# Patient Record
Sex: Female | Born: 1995
Health system: Southern US, Community
[De-identification: ages and names within clinical notes are randomized; demographics above are authoritative.]

## PROBLEM LIST (undated history)

## (undated) DIAGNOSIS — M25561 Pain in right knee: Secondary | ICD-10-CM

## (undated) HISTORY — PX: KNEE ARTHROSCOPY: SHX127

---
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
## 2017-03-18 DIAGNOSIS — N926 Irregular menstruation, unspecified: Secondary | ICD-10-CM | POA: Diagnosis not present

## 2017-03-23 IMAGING — CR DG ANKLE COMPLETE 3+V*R*
4 series · 4 of 4 positions shown · non-contrast
Comparison: None.

CLINICAL DATA: Acute right ankle pain after twisting injury.

EXAM:
RIGHT ANKLE - COMPLETE 3+ VIEW

[AP]
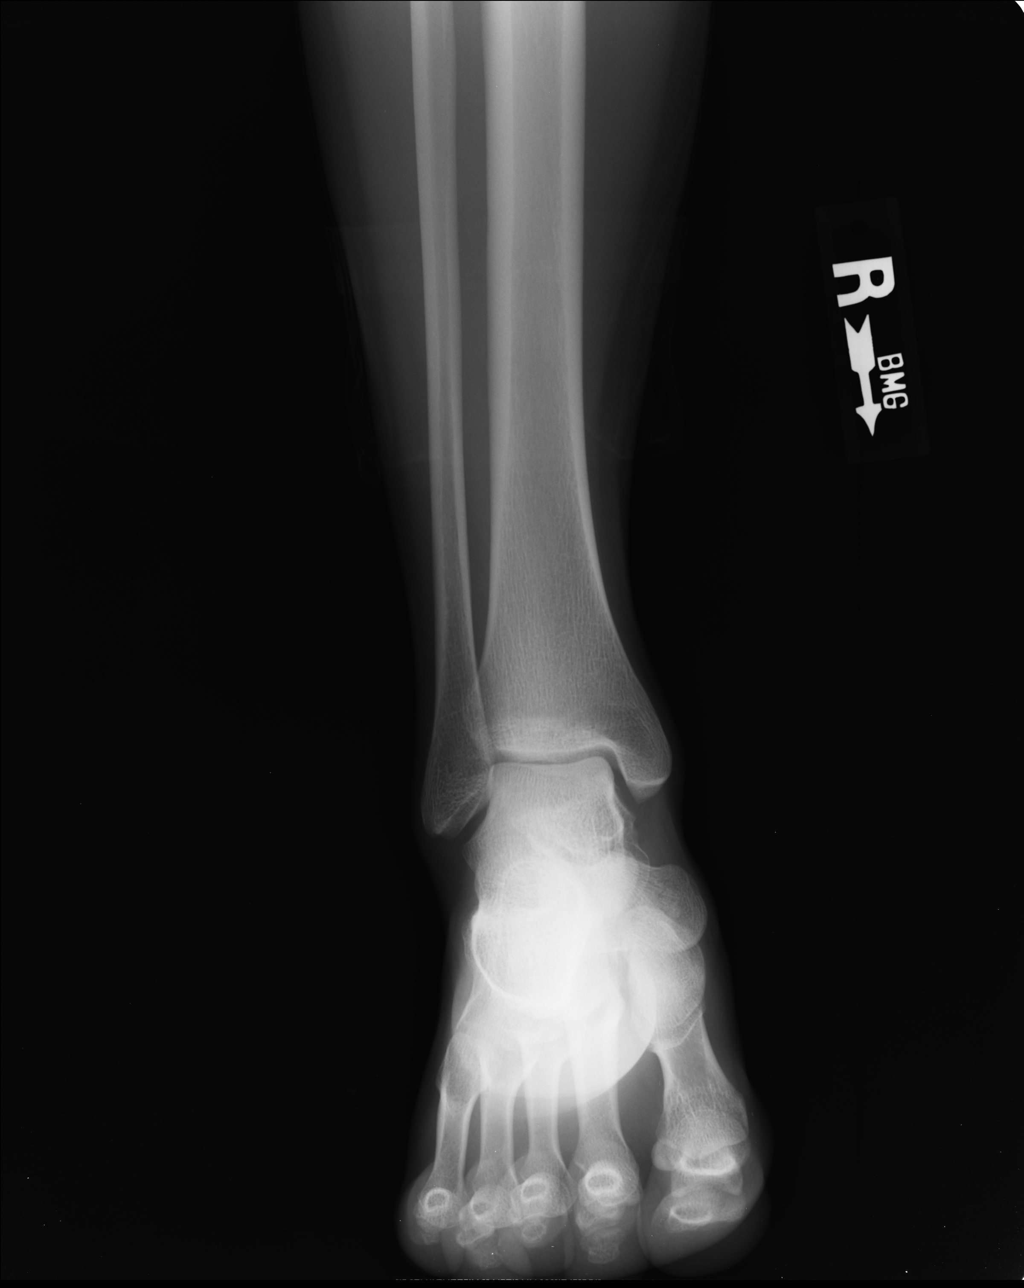

[ap obl int rot]
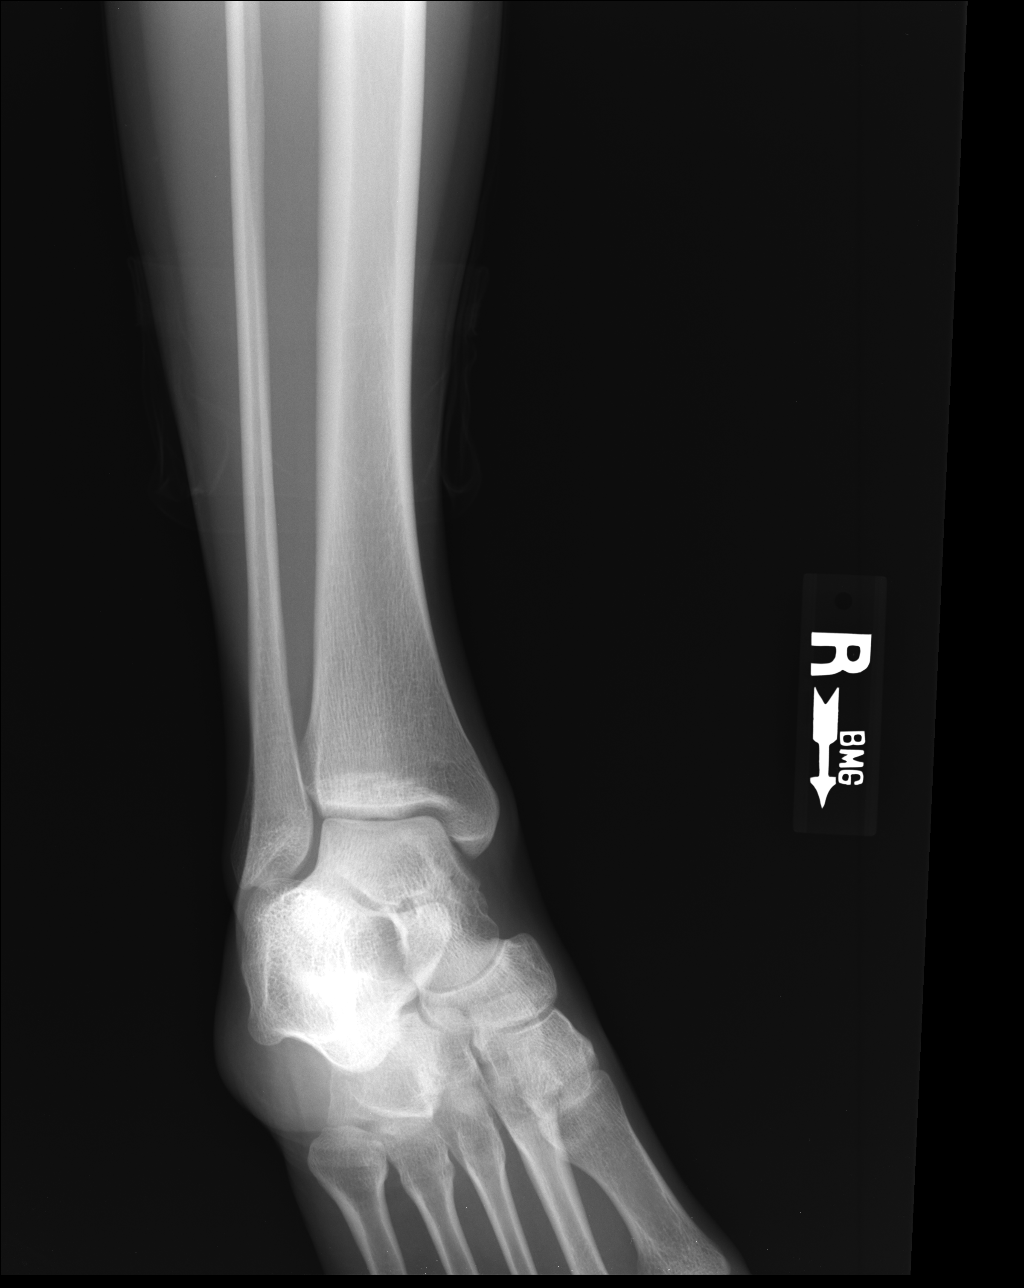

[medial obl]
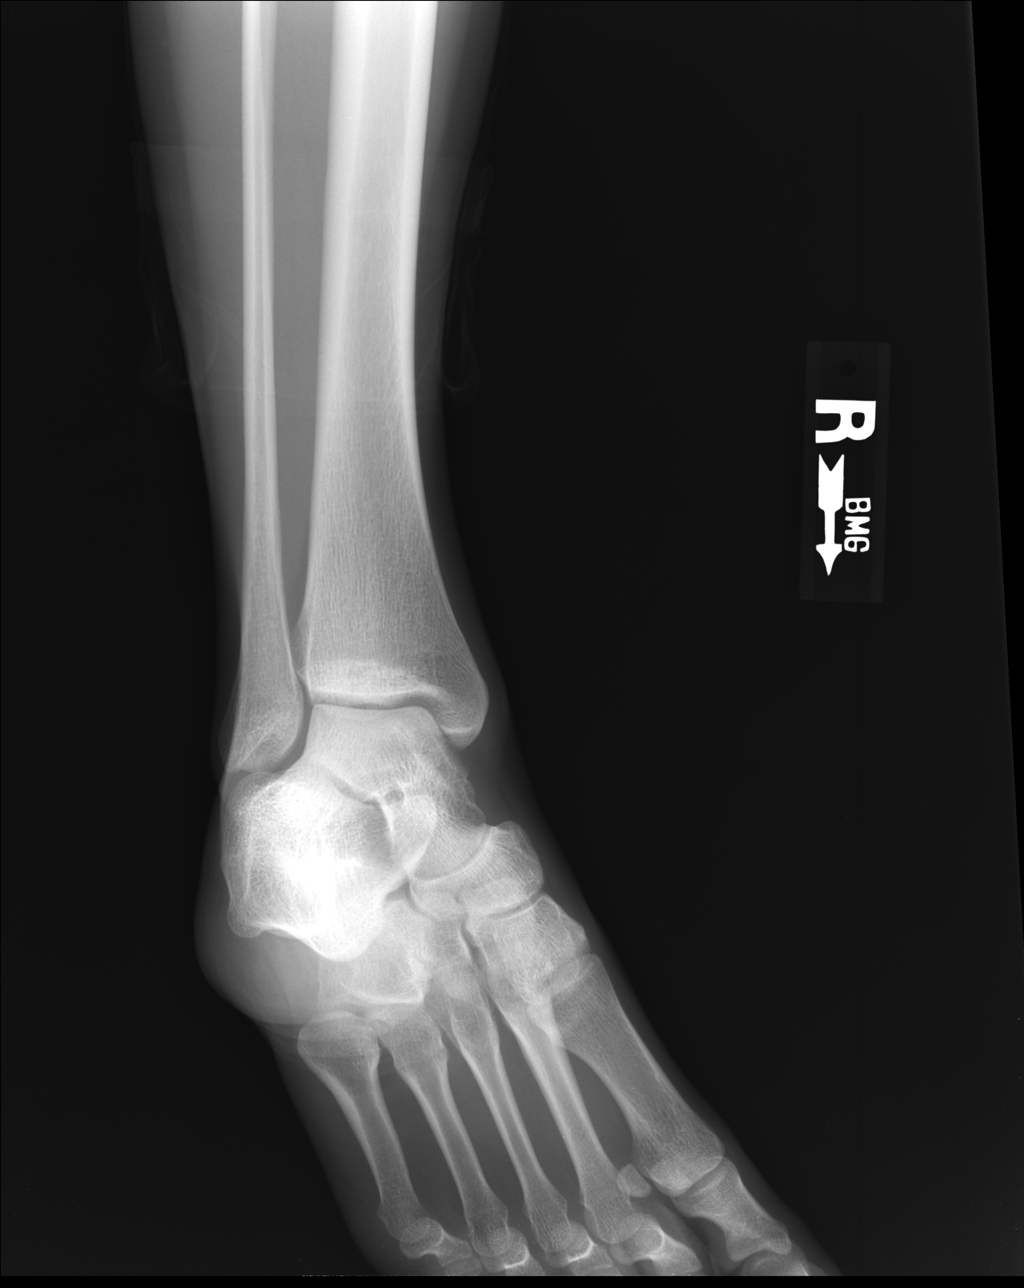

[lateral]
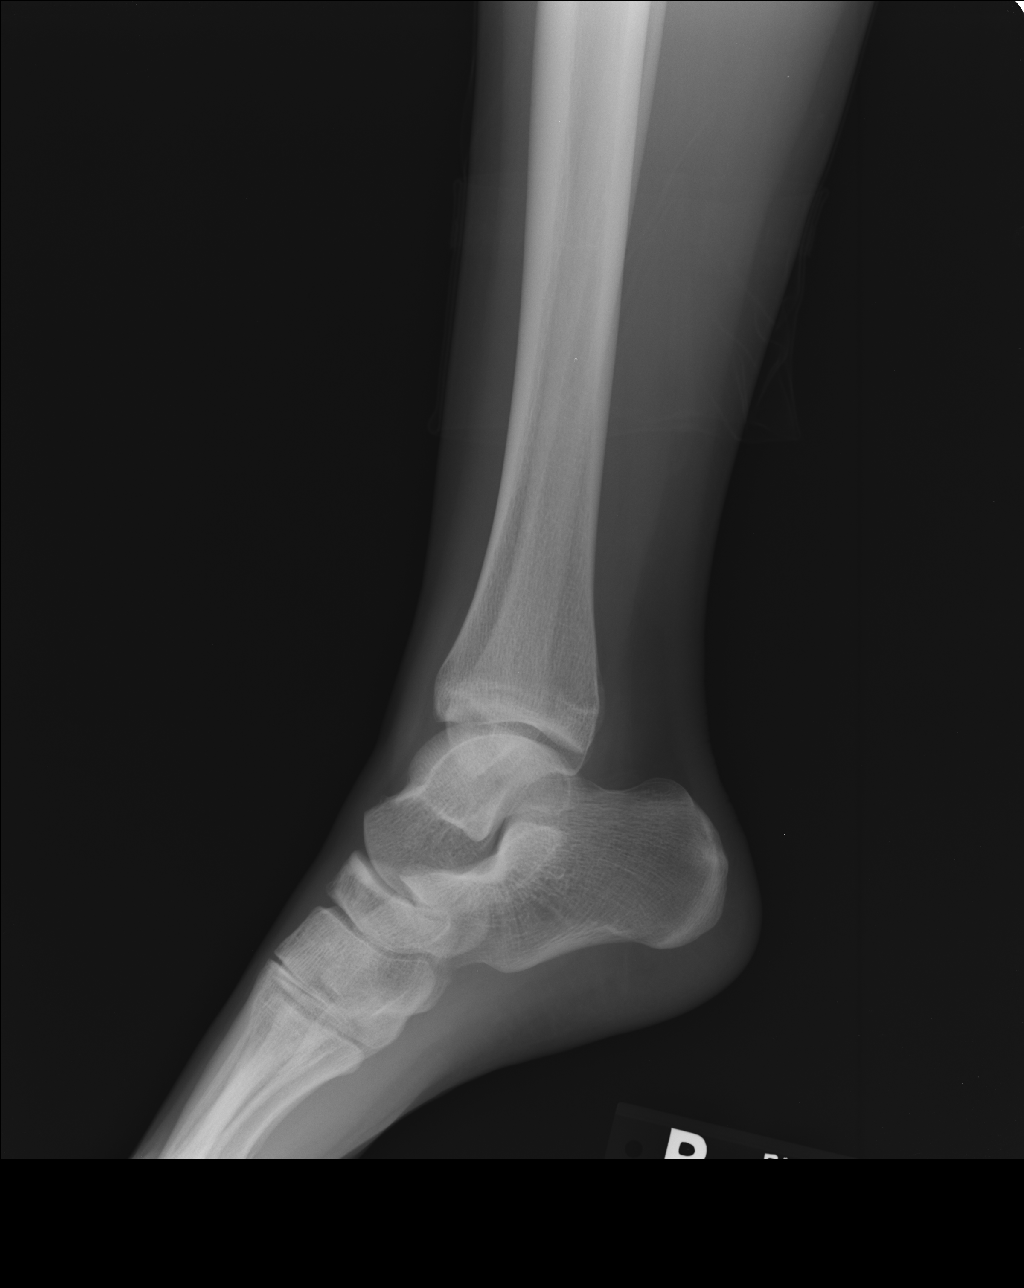

[4 of 4 positions shown; findings below may reference images not displayed]

FINDINGS: There is no evidence of fracture, dislocation, or joint effusion.
There is no evidence of arthropathy or other focal bone abnormality.
Soft tissues are unremarkable.
IMPRESSION: Normal right ankle.

## 2017-06-16 DIAGNOSIS — N390 Urinary tract infection, site not specified: Secondary | ICD-10-CM | POA: Diagnosis not present

## 2017-09-17 DIAGNOSIS — N926 Irregular menstruation, unspecified: Secondary | ICD-10-CM | POA: Diagnosis not present

## 2018-01-26 DIAGNOSIS — J069 Acute upper respiratory infection, unspecified: Secondary | ICD-10-CM | POA: Diagnosis not present

## 2018-06-13 DIAGNOSIS — R300 Dysuria: Secondary | ICD-10-CM | POA: Diagnosis not present

## 2018-06-13 DIAGNOSIS — N390 Urinary tract infection, site not specified: Secondary | ICD-10-CM | POA: Diagnosis not present

## 2018-06-14 DIAGNOSIS — Z23 Encounter for immunization: Secondary | ICD-10-CM | POA: Diagnosis not present

## 2018-08-30 DIAGNOSIS — N926 Irregular menstruation, unspecified: Secondary | ICD-10-CM | POA: Diagnosis not present

## 2018-09-24 DIAGNOSIS — T23091A Burn of unspecified degree of multiple sites of right wrist and hand, initial encounter: Secondary | ICD-10-CM

## 2018-09-24 NOTE — ED Provider Notes (Signed)
[facility]      Pt Name: Shannon AlconMorgan Mitnick  MRN: [id number]  Birthdate 05/12/1996  Date of evaluation: 09/25/18      CHIEF COMPLAINT       Chief Complaint   Patient presents with   ??? Hand Burn         HISTORY OF PRESENT ILLNESS    Shannon Jones is a 22 y.o. female who presents to the emergency department after patient sustained superficial burns on the right wrist and right hand after accidentally spilling the [place] soup out of the open on her right hand while at work about 1 hour prior to arrival.  Patient complains of burning pain 6-7 out of 10 in intensity worse with the movements and better with rest.  Patient has taken Motrin 800 mg without any relief.  She also put a wet rag on it and now has ice on it upon arrival.  She denies any tingling, numbness or weakness distally.  Last tetanus injection is up-to-date.      REVIEW OF SYSTEMS       Review of Systems   Constitutional: Negative for chills and fever.   Skin: Positive for color change and wound.   Neurological: Negative for weakness and numbness.   All other systems reviewed and are negative.        PAST MEDICAL HISTORY    has no past medical history on file.    SURGICAL HISTORY      has a past surgical history that includes knee surgery.    CURRENT MEDICATIONS       Discharge Medication List as of 09/24/2018  9:39 PM          ALLERGIES     has No Known Allergies.    FAMILY HISTORY     has no family status information on file.      family history is not on file.    SOCIAL HISTORY      reports that she has never smoked. She has never used smokeless tobacco. She reports current alcohol use. She reports current drug use. Drug: Marijuana.    PHYSICAL EXAM     INITIAL VITALS:  height is 5\' 1"  (1.549 m) and weight is 43.1 kg (95 lb). Her oral temperature is 98.2 ??F (36.8 ??C). Her blood pressure is 110/72 and her pulse is 68. Her respiration is 18 and oxygen saturation is 98%.      Physical Exam  Vitals signs and nursing  note reviewed.   Constitutional:       Appearance: She is well-developed.   HENT:      Head: Normocephalic and atraumatic.      Nose: Nose normal.   Eyes:      Pupils: Pupils are equal, round, and reactive to light.   Neck:      Musculoskeletal: Normal range of motion and neck supple.   Cardiovascular:      Rate and Rhythm: Normal rate and regular rhythm.      Heart sounds: Normal heart sounds. No murmur.   Pulmonary:      Effort: Pulmonary effort is normal. No respiratory distress.      Breath sounds: Normal breath sounds.   Abdominal:      General: Bowel sounds are normal. There is no distension.      Palpations: Abdomen is soft.      Tenderness: There is no tenderness.   Skin:     General: Skin is warm  and dry.      Findings: Burn present.      Comments: Patient has superficial partial-thickness burns mostly first-degree on the volar aspect of the right wrist extending to the thenar eminence and then on the dorsal aspect of the wrist.  None of the burns are circumferential.  Neurovascular examination is intact distally.  There is a small area of superficial vesicle on the lateral aspect of the right wrist on the volar side   Neurological:      General: No focal deficit present.      Mental Status: She is alert and oriented to person, place, and time.           DIFFERENTIAL DIAGNOSIS/ MDM:     Superficial partial-thickness burn right wrist and right hand    DIAGNOSTIC RESULTS     EKG: All EKG's are interpreted by the Emergency Department Physician who either signs or Co-signs this chart in the absence of a cardiologist.    None obtained    RADIOLOGY:   Non-plain film images such as CT, Ultrasound and MRI are read by the radiologist. Plain radiographic images are visualized and the radiologist interpretations are reviewed as follows:     No results found.      LABS:  No results found for this visit on 09/24/18.      EMERGENCY DEPARTMENT COURSE:   Vitals:    Vitals:    09/24/18 2100   BP: 110/72   Pulse: 68   Resp:  18   Temp: 98.2 ??F (36.8 ??C)   TempSrc: Oral   SpO2: 98%   Weight: 43.1 kg (95 lb)   Height: 5\' 1"  (1.549 m)     -------------------------  BP: 110/72, Temp: 98.2 ??F (36.8 ??C), Pulse: 68, Resp: 18    Orders Placed This Encounter   Medications   ??? HYDROcodone-acetaminophen (NORCO) 5-325 MG per tablet 1 tablet   ??? silver sulfADIAZINE (SILVADENE) 1 % cream   ??? ibuprofen (IBU) 400 MG tablet     Sig: Take 1 tablet by mouth every 6 hours as needed for Pain     Dispense:  120 tablet     Refill:  0   ??? HYDROcodone-acetaminophen (NORCO) 5-325 MG per tablet     Sig: Take 1 tablet by mouth every 6 hours as needed for Pain for up to 3 days.     Dispense:  10 tablet     Refill:  0         Patient was given Norco 1 tablet and Silvadene cream but with dressing was applied.  Plan is to discharge the patient with her wound care instructions, burn care instructions and watch carefully for any signs of infection.  Patient was advised take Tylenol and ibuprofen as needed for the pain, Norco for uncontrolled pain, follow-up with PCP in 2 days, return if worse    CONSULTS:  None    PROCEDURES:  None    FINAL IMPRESSION      1. Superficial partial thickness burn of hand          DISPOSITION/PLAN       PATIENT REFERRED TO:  call 419-same-day for follow up    Call in 2 days  For reevaluation of current symptoms    [facility] ED  [phone]  [place] [address]  [phone]    If symptoms worsen      DISCHARGE MEDICATIONS:  Discharge Medication List as of 09/24/2018  9:39 PM  START taking these medications    Details   ibuprofen (IBU) 400 MG tablet Take 1 tablet by mouth every 6 hours as needed for Pain, Disp-120 tablet, R-0Print      HYDROcodone-acetaminophen (NORCO) 5-325 MG per tablet Take 1 tablet by mouth every 6 hours as needed for Pain for up to 3 days., Disp-10 tablet, R-0Print             (Please note that portions of this note were completed with a voice recognition program.  Efforts were made to  edit the dictations but occasionally words are mis-transcribed.)    Lenice PressmanAshok N Ka Flammer,, MD, F.A.C.E.P.  Attending Emergency Medicine Physician     Lenice PressmanAshok N Michaelah Credeur, MD  09/25/18 [phone]

## 2018-09-24 NOTE — ED Notes (Signed)
Pt presents after spilling french onion soup out of the oven on her right hand at work 1hr pta. Pt took motrin and put a wet rag on it and now has ice on it. Area of about 25% of the top of her hand is reddened. No blisters noted. Pt is A&O and breathing is even and non labored.       Clovis CaoBrittany M Eldra Word, RN  09/24/18 2107

## 2018-09-24 NOTE — ED Notes (Signed)
Patient cleared for discharge. Patient discharge instructions explained, Rx given and explained to patient. Patient verbalized understanding of all instructions and all patient questions answered to their satisfaction. Patient departs in stable condition.        Clovis CaoBrittany M Winslow Verrill, RN  09/24/18 2155

## 2018-09-25 ENCOUNTER — Inpatient Hospital Stay
Admit: 2018-09-25 | Discharge: 2018-09-25 | Disposition: A | Discharge status: Home / Self Care | Payer: PRIVATE HEALTH INSURANCE | Attending: Specialist

## 2018-09-25 MED ORDER — HYDROCODONE-ACETAMINOPHEN 5-325 MG PO TABS
5-325 MG | Freq: Once | ORAL | Status: AC
Start: 2018-09-25 — End: 2018-09-24
  Administered 2018-09-25: 03:00:00 1 via ORAL

## 2018-09-25 MED ORDER — HYDROCODONE-ACETAMINOPHEN 5-325 MG PO TABS
5-325 MG | ORAL_TABLET | Freq: Four times a day (QID) | ORAL | 0 refills | Status: AC | PRN
Start: 2018-09-25 — End: 2018-09-27

## 2018-09-25 MED ORDER — IBUPROFEN 400 MG PO TABS
400 MG | ORAL_TABLET | Freq: Four times a day (QID) | ORAL | 0 refills | Status: AC | PRN
Start: 2018-09-25 — End: ?

## 2018-09-25 MED ORDER — SILVER SULFADIAZINE 1 % EX CREA
1 % | Freq: Once | CUTANEOUS | Status: AC
Start: 2018-09-25 — End: 2018-09-24
  Administered 2018-09-25: 03:00:00 via TOPICAL

## 2018-09-25 MED FILL — HYDROCODONE-ACETAMINOPHEN 5-325 MG PO TABS: 5-325 mg | ORAL | Qty: 1

## 2018-09-25 MED FILL — SILVER SULFADIAZINE 1 % EX CREA: 1 % | CUTANEOUS | Qty: 50

## 2018-11-20 DIAGNOSIS — R509 Fever, unspecified: Secondary | ICD-10-CM | POA: Diagnosis not present

## 2018-11-20 DIAGNOSIS — B349 Viral infection, unspecified: Secondary | ICD-10-CM | POA: Diagnosis not present

## 2018-12-19 DIAGNOSIS — M1711 Unilateral primary osteoarthritis, right knee: Secondary | ICD-10-CM | POA: Diagnosis not present

## 2019-01-03 DIAGNOSIS — G8929 Other chronic pain: Secondary | ICD-10-CM | POA: Diagnosis not present

## 2019-01-03 DIAGNOSIS — M958 Other specified acquired deformities of musculoskeletal system: Secondary | ICD-10-CM | POA: Diagnosis not present

## 2019-01-03 DIAGNOSIS — M25561 Pain in right knee: Secondary | ICD-10-CM | POA: Diagnosis not present

## 2019-01-04 DIAGNOSIS — M25461 Effusion, right knee: Secondary | ICD-10-CM | POA: Diagnosis not present

## 2019-01-10 DIAGNOSIS — M958 Other specified acquired deformities of musculoskeletal system: Secondary | ICD-10-CM | POA: Diagnosis not present

## 2019-02-06 DIAGNOSIS — Z1159 Encounter for screening for other viral diseases: Secondary | ICD-10-CM | POA: Diagnosis not present

## 2019-02-09 DIAGNOSIS — M6588 Other synovitis and tenosynovitis, other site: Secondary | ICD-10-CM | POA: Diagnosis not present

## 2019-02-09 DIAGNOSIS — M65861 Other synovitis and tenosynovitis, right lower leg: Secondary | ICD-10-CM | POA: Diagnosis not present

## 2019-02-09 DIAGNOSIS — M958 Other specified acquired deformities of musculoskeletal system: Secondary | ICD-10-CM | POA: Diagnosis not present

## 2019-04-20 ENCOUNTER — Ambulatory Visit (INDEPENDENT_AMBULATORY_CARE_PROVIDER_SITE_OTHER): Payer: 59 | Admitting: Psychology

## 2019-04-20 DIAGNOSIS — F4323 Adjustment disorder with mixed anxiety and depressed mood: Secondary | ICD-10-CM

## 2019-04-25 ENCOUNTER — Ambulatory Visit (INDEPENDENT_AMBULATORY_CARE_PROVIDER_SITE_OTHER): Payer: 59 | Admitting: Psychology

## 2019-04-25 DIAGNOSIS — F4323 Adjustment disorder with mixed anxiety and depressed mood: Secondary | ICD-10-CM

## 2019-05-08 ENCOUNTER — Ambulatory Visit (INDEPENDENT_AMBULATORY_CARE_PROVIDER_SITE_OTHER): Payer: 59 | Admitting: Psychology

## 2019-05-08 DIAGNOSIS — F4323 Adjustment disorder with mixed anxiety and depressed mood: Secondary | ICD-10-CM

## 2019-05-25 ENCOUNTER — Ambulatory Visit: Payer: 59 | Admitting: Psychology

## 2019-05-30 ENCOUNTER — Ambulatory Visit: Payer: 59 | Admitting: Psychology

## 2019-06-19 ENCOUNTER — Inpatient Hospital Stay: Admit: 2019-06-19 | Payer: PRIVATE HEALTH INSURANCE

## 2019-06-19 DIAGNOSIS — M2391 Unspecified internal derangement of right knee: Secondary | ICD-10-CM

## 2019-06-19 NOTE — Consults (Signed)
$'[]'d$  [place]  [address].  [phone]  F: [phone] '[]'$  [place]  [address]   [address]  P: [phone]  F: [phone] '[x]'$  [place]  [place]  P: [phone]  F: [phone] '[]'$  [place]  [place]   [address]   [place]: [phone]  F: [phone]      Physical Therapy Lower Extremity Evaluation    Date:  06/19/2019  Patient: Shannon Jones   DOB: 1996/03/30  MRN: [id number]  Physician: Douglass Rivers, MD  Insurance: [facility] (28 visits remain; no copay, ded met, 20%)  Medical Diagnosis: R internal derangement of knee  Rehab Codes: M25.561, M25.552  Onset date: 05/26/19    Next Dr's appt.: 10/6    Subjective:   CC: R knee pain due to numerous surgeries  HPI: 6th grade: had knee pain and MR revealed all cartilage was dead. No specific injury.   Had surgery and stem cell.  Then this past march, had a pop in her knee and now the hole was growing.  This last surgery had a surgery where cadaver bone was inserted into the hole in the articular cartilage.  Per pt, the MD instructed her not to wear the brace all of the time and not locked into extension.  Did HEP (leg lifts, hip isometrics, heel slides, SL hip abd and clams.) PT in [place].   After a year, she should be able to workout and run, up to 2 miles.   4th surgery was in May 2020, so she hasn't much in terms of PT.  Walks in [facility] with brace in unlocked status    PMHx: '[x]'$  Unremarkable '[]'$  Diabetes '[]'$  HTN  '[]'$  Pacemaker   '[]'$  MI/Heart Problems '[]'$  Cancer '[]'$  Arthritis  '[]'$  Other:              '[]'$  Refer to full medical chart  In EPIC     Tests: '[]'$  X-Ray: '[x]'$  MRI:  '[]'$  Other:    Medications: '[]'$  Refer to full medical record '[x]'$  None '[]'$  Other:  Allergies:      '[]'$  Refer to full medical record  '[x]'$  None '[]'$   Other:      Work Runner, broadcasting/film/video senior     Pain:  '[x]'$  Yes  '[]'$  No Location: R knee Pain Rating: (0-10 scale)4/10  Pain altered Tx:  '[]'$  Yes  '[x]'$  No  Action:    Symptoms:  '[x]'$  Improving '[]'$  Worsening '[]'$  Same  Better:  '[]'$  AM    '[]'$  PM    '[]'$  Sit    '[]'$  Rise/Sit    '[]'$ Stand    '[]'$  Walk    '[]'$  Lying    '[]'$  Other:  Worse: '[]'$  AM    '[]'$  PM    '[]'$  Sit    '[]'$  Rise/Sit    '[]'$ Stand    '[]'$  Walk    '[]'$  Lying    '[]'$  Bend                      '[]'$  Valsalva    '[]'$  Other:  Sleep: '[]'$  OK    '[]'$  Disturbed    Objective:    ROM  ?? A/P STRENGTH TESTS (+/-) Left Right Not Tested  Left Right Left Right Ant. Drawer   '[]'$    Hip Flex     Post. Drawer   '[]'$    Ext     Lachmans   '[]'$    ER     Valgus Stress   '[]'$    IR     Varus Stress   '[]'$    ABD     McMurray???s   '[]'$    ADD     Apley???s Comp.   '[]'$    Knee Flex  90   Apley???s Dist.   '[]'$    Ext  3   Hip Scouring   '[]'$    Ankle DF     FABER???s   '[]'$    PF     Piriformis   '[]'$    INV     Ober???s   '[]'$    EVER     Talor Tilt   '[]'$         Pat-Fem Grind   '[]'$    No special tests secondary to surgical contraindications    OBSERVATION No Deficit Deficit Not Tested Comments   Posture       Forward Head '[]'$  '[]'$  '[]'$     Rounded Shoulders '[]'$  '[]'$  '[]'$     Kyphosis '[]'$  '[]'$  '[]'$     Lordosis '[]'$  '[]'$  '[]'$     Lateral Shift '[]'$  '[]'$  '[]'$     Scoliosis '[]'$  '[]'$  '[]'$     Iliac Crest '[]'$  '[]'$  '[]'$     PSIS '[]'$  '[]'$  '[]'$     ASIS '[]'$  '[]'$  '[]'$     Genu Valgus '[]'$  '[]'$  '[]'$     Genu Varus '[]'$  '[]'$  '[]'$     Genu Recurvatum '[]'$  '[]'$  '[]'$     Pronation '[]'$  '[]'$  '[]'$     Supination '[]'$  '[]'$  '[]'$     Leg Length Discrp '[]'$  '[]'$  '[]'$     Slumped Sitting '[]'$  '[]'$  '[]'$     Palpation '[]'$  '[]'$  '[]'$     Sensation '[]'$  '[]'$  '[]'$     Edema '[]'$  '[x]'$  '[]'$  Minimal post surgical swelling   Neurological '[]'$  '[]'$  '[]'$     Patellar Mobility '[x]'$  '[]'$  '[]'$     Patellar Orientation '[]'$  '[]'$  '[]'$     Gait '[]'$  '[]'$  '[]'$  Analysis:NWB w/ Bil ax crutches and unlocked brace     Pt reports that Dr. Prudy Feeler instructed her to keep the brace unlocked. I will call to verify    FUNCTION Normal Difficult Unable   Sitting '[]'$  '[]'$  '[]'$    Standing '[]'$  '[]'$  '[]'$    Ambulation '[]'$  '[]'$  '[]'$    Groom/Dress '[]'$  '[]'$  '[]'$    Lift/Carry '[]'$  '[]'$  '[]'$     Stairs '[]'$  '[]'$  '[]'$    Bending '[]'$  '[]'$  '[]'$    Squat '[]'$  '[]'$  '[]'$    Kneel '[]'$  '[]'$  '[]'$      BALANCE/PROPRIOCEPTION              '[x]'$  Not tested   Single leg stance       R                     L                                PAIN   Eyes open                             Sec.                  Sec                  .'[]'$   Eyes closed                          Sec.                  Sec                  .'[]'$         Functional Test: LEFI Score: 81% functionally impaired     Comments:    Assessment:  Patient would benefit from skilled physical therapy services in order to: improve ROM, strength, and functional ability (running, exercising)    Problems:    '[x]'$  ? Pain: in R knee  '[x]'$  ? ROM: in R knee  '[x]'$  ? Strength: of R LE  '[x]'$  ? Function:   '[]'$  Other:       STG: (to be met in 12 treatments)  1. ? Pain: <2/10  2. ? ROM: of R knee  3. ? Strength: to at least 4/5 with all hip movements  4. ? Function: LEFI <60% interference  5. Patient to be independent with home exercise program as demonstrated by performance with correct form without cues.    LTG: (to be met in 30 treatments)  1. No pain in R knee  2. Full AROM of R knee  3. 5/5 strength with all R LE movements  4. LEFI <10% interference  5. Able to run 2 miles  6. Able to exercise at least 3x/wk                   Patient goals:"get to walking and running"    Rehab Potential:  '[x]'$  Good  '[]'$  Fair  '[]'$  Poor   Suggested Professional Referral:  '[x]'$  No  '[]'$  Yes:  Barriers to Goal Achievement:  '[x]'$  No  '[]'$  Yes:  Domestic Concerns:  '[x]'$  No  '[]'$  Yes:    Pt. Education:  '[x]'$  Plans/Goals, Risks/Benefits discussed  '[x]'$  Home exercise program    Method of Education: '[x]'$  Verbal  '[x]'$  Demo  '[]'$  Written  Comprehension of Education:  '[x]'$  Verbalizes understanding.  '[]'$  Demonstrates understanding.  '[]'$  Needs Review.  '[]'$  Demonstrates/verbalizes understanding of HEP/Ed previously given.    Treatment Plan:  '[x]'$  Therapeutic Exercise   97110  '[]'$  Iontophoresis: 4 mg/mL Dexamethasone Sodium Phosphate 40-120 mAmin  97033   '[x]'$   Therapeutic Activity  97530 '[x]'$  Vasopneumatic cold with compression  97016    '[]'$  Gait Training   97116 '[]'$  Ultrasound   97035   '[x]'$  Neuromuscular Re-education  97112 '[]'$  Electrical Stimulation Unattended  97014   '[x]'$  Manual Therapy  97140 '[]'$  Electrical Stimulation Attended  97032   '[x]'$  Instruction in HEP  '[]'$  Lumbar/Cervical Traction  97012   '[x]'$  Aquatic Therapy   97113 '[]'$  Cold/hotpack    '[]'$  Massage   97124      '[]'$  Dry Needling, 1 or 2 muscles  20560   '[]'$  Biofeedback, first 15 minutes   90912  '[]'$  Biofeedback, additional 15 minutes   90913 '[]'$  Dry Needling, 3 or more muscles  20561       Frequency:  2x/week for 12* visits        Today???s Treatment:  Modalities:   Treatment Location  Left      Right                          Position   knee '[]'$           [  x]  '[x]'$  Supine    '[]'$  Prone   '[]'$  Side lying  '[]'$  Sitting          Treatment Modality   2 Vasocompression    34?? temp    Med pressure     15 min   2 Electrical Stim:    '[]'$  IFC     '[]'$  MFAC      '[x]'$  VMS                                                    #Channels:  '[x]'$  1        '[]'$  2       '[]'$  Other:   1 Other:  ex       Precautions: see protocol  Exercises:  g  4 wks  9/18   6 wks 10/2 PROM/AROM to tolerance, patellar and tib-fib mobs, QS, hamstring and glut sets, SLR, sidelying and core ex   8 wks 10/16: BEgin gait training, begin closed chain, wall sits, shurttle, mini squats, toe raises, L legged WB balance ex  Exercise Reps/ Time Weight/ Level Issued for HEP Completed Comments   Supine        Quad sets 10x10"  x x    SLR 2x10  x x Reinforce no    Hamstring sets 10x10"  x x    Glut sets 10x10"  x x    Prone        Hip ext 2x10  x x 2 pillows under waist    Sidelying        Rose hip abd 2x10  x x Use board   Clamshells  20 ea  x x                                            Other:    Specific Instructions for next treatment: TC to ortho awaiting further clarification on ex, goals,       Evaluation Complexity:  History (Personal factors, comorbidities) '[]'$  0 '[x]'$  1-2 '[]'$  3+   Exam  (limitations, restrictions) '[x]'$  1-2 '[]'$  3 '[]'$  4+   Clinical presentation (progression) '[x]'$  Stable '[]'$  Evolving  '[]'$  Unstable   Decision Making '[x]'$  Low '[]'$  Moderate '[]'$  High    '[x]'$  Low Complexity '[]'$  Moderate Complexity '[]'$  High Complexity       Treatment Charges: Mins Units   '[x]'$  Evaluation       '[x]'$   Low       '[]'$   Moderate       '[]'$   High  1   '[x]'$   Modalities 20 1   '[x]'$   Ther Exercise 15 1   '[]'$   Manual Therapy     '[]'$   Ther Activities     '[]'$   Aquatics     '[x]'$   Vasocompression 20 1   '[]'$   Other       TOTAL TREATMENT TIME: 75    Time in:1400   Time EMV:3612  Electronically signed by: Hadley Pen, PT        Physician Signature:________________________________Date:__________________  By signing above or cosigning this note, I have reviewed this plan of care and certify a need for medically necessary rehabilitation services.     *PLEASE  SIGN ABOVE AND FAX BACK ALL PAGES*

## 2019-06-21 ENCOUNTER — Inpatient Hospital Stay: Admit: 2019-06-21 | Discharge: 2019-06-21 | Payer: PRIVATE HEALTH INSURANCE

## 2019-06-21 NOTE — Other (Signed)
[]  [facility] &  Therapy  [address]  [phone]  F: [phone] []  [place]  [address]   [address]  P: [phone]  F: [phone] [x]  [place]  [place]  P: [phone]  F: [phone] []  [facility]  Outpatient Rehabilitation &  Therapy  [place]   [address]   [place]: [phone]  F: [phone]      Physical Therapy Daily Treatment Note    Date:  06/21/2019  Patient Name:  Shannon Jones    DOB:  [birth year]  MRN: [id number]  Physician: Douglass Rivers, MD                 Insurance: [facility] (28 visits remain; no copay, ded met, 20%)  Medical Diagnosis: R internal derangement of knee                       Rehab Codes: M25.561, M25.552  Visit# / total visits: 2/12     Cancels/No Shows: 0/0    Subjective:    Pain:  [x]  Yes  []  No Location: R knee  Pain Rating: (0-10 scale) 1/10  Pain altered Tx:  [x]  No  []  Yes  Action:  Comments: Pt reports mild pain in R knee today, noting that it gets stiff when sitting in a car in the bent position. Pt states she tolerated last session well, noting "I wasn't too sore afterwards."    Objective:  Modalities:   Treatment Location  Left      Right                          Position   knee [] ?          [x] ?  [x] ? Supine    [] ? Prone   [] ? Side lying  [] ? Sitting                                          ?? Treatment Modality   2 Vasocompression    34?? temp    Med pressure     15 min   2 Electrical Stim:    [] ? IFC     [] ? MFAC      [x] ? VMS                                                    #Channels:  [x] ? 1        [] ? 2       [] ? Other:   1 Other:  ex                             Precautions: see protocol-No progressions until response from ortho  Exercises:  g  4 wks  9/18   6 wks 10/2 PROM/AROM to tolerance, patellar and tib-fib mobs, QS, hamstring and glut sets, SLR,  sidelying and core ex   8 wks 10/16: Begin gait  training, begin closed chain, wall sits, shurttle, mini squats, toe raises, L legged WB balance ex  Exercise Reps/ Time Weight/ Level Issued for HEP Completed Comments   Supine ?? ?? ?? ?? ??   Quad sets 10x10" ?? x x ??   SLR 2x10 ?? x x Reinforce no lag   Hamstring sets 10x10" ?? x x ??   Glut sets 10x10" ?? x x ??   Prone ?? ?? ??  ??   Hip ext 2x15 ?? x x 2 pillows under waist    Sidelying ?? ?? ??  ??   Rose hip abd 2x15 ?? x x Use board   Clamshells  2x20 ?? x x ??   Other:  ??  Specific Instructions for next treatment:  TC to ortho awaiting further clarification on ex, goals,     Treatment Charges: Mins Units   [x]   Modalities: [place] 20 1   [x]   Ther Exercise 15 1   []   Manual Therapy     []   Ther Activities     []   Aquatics     [x]   Vasocompression 20 1   []   Other     Total Treatment time 55 3       Assessment: [x]  Progressing toward goals. Continued with current tx per log until further information from ortho regarding progressions. Min v/c and tactile cuing for increased quad activation prior to SLR in order to prevent quad lag with good carryover to pt; pt still demonstrated mild quad lag during SLR d/t R quad weakness. Ended with [place] e-stim to promote and enhance quad activation with good pt tolerance. Pt noted feeling good upon completion of therapy.     []  No change.     []  Other:  [x]  Patient would continue to benefit from skilled physical therapy services in order to:  improve ROM, strength, and functional ability (running, exercising)    STG: (to be met in 12 treatments)  1. ? Pain: <2/10  2. ? ROM: of R knee  3. ? Strength: to at least 4/5 with all hip movements  4. ? Function: LEFI <60% interference  5. Patient to be independent with home exercise program as demonstrated by performance with correct form without cues.  ??  LTG: (to be met in 30 treatments)  1. No pain in R knee  2. Full AROM of R knee  3. 5/5 strength with all R LE movements  4. LEFI <10%  interference  5. Able to run 2 miles  6. Able to exercise at least 3x/wk  ??                 Patient goals:"get to walking and running"    Pt. Education:  [x]  Yes  []  No  []  Reviewed Prior HEP/Ed  Method of Education: [x]  Verbal  [x]  Demo  []  Written  Comprehension of Education:  [x]  Verbalizes understanding.  [x]  Demonstrates understanding.  []  Needs review.  []  Demonstrates/verbalizes HEP/Ed previously given.     Plan: [x]  Continue current frequency toward long and short term goals.          Time In: 5:33pm            Time Out: 6:30pm    Electronically signed by:  Arlana Lindau, PTA

## 2019-06-26 ENCOUNTER — Inpatient Hospital Stay: Admit: 2019-06-26 | Discharge: 2019-06-26 | Payer: PRIVATE HEALTH INSURANCE

## 2019-06-26 NOTE — Other (Signed)
[] [facility] &  Therapy  [address]  [phone]  F: [phone] [] [place]  [address]   [address]  P: [phone]  F: [phone] [x] [place] &  Therapy  [place]  P: [phone]  F: [phone] [] [place]  [place]   [address]   [place]: [phone]  F: [phone]      Physical Therapy Daily Treatment Note    Date:  06/26/2019  Patient Name:  Shannon Jones    DOB:  1995-11-08  MRN: [id number]  Physician: Douglass Rivers, MD                 Insurance: [facility] (28 visits remain; no copay, ded met, 20%)  Medical Diagnosis: R internal derangement of knee                       Rehab Codes: M25.561, M25.552  Onset date: 05/26/19     Visit# / total visits: 3/12     Cancels/No Shows: 0/0    Subjective:    Pain:  [x] Yes  [] No Location: R knee  Pain Rating: (0-10 scale) 1/10  Pain altered Tx:  [x] No  [] Yes  Action:  Comments: Pt mentioned no pain upon entry and is performing her HEP.     Objective:  Modalities:   Treatment Location  Left      Right                          Position   knee []?          [x]?  [x]? Supine    []? Prone   []? Side lying  []? Sitting                                          ?? Treatment Modality   2 Vasocompression    34?? temp    Med pressure     15 min   2 Electrical Stim:    []? IFC     []? Staples      [x]? VMS                                                    #Channels:  [x]? 1        []? 2       []? Other:   1 Other:  ex                             Precautions: see protocol-  Exercises:  g  4-6 wks 10/2 advance weight bearing 25% weekly until full, PROM/AROM to tolerance, patellar and tib-fib mobs, QS, hamstring and glut sets, SLR, sidelying and core ex   8 wks 10/16: Begin gait training, begin closed chain, wall sits, shurttle, mini squats,  toe raises, L legged WB balance ex  Exercise Reps/ Time  Weight/ Level Issued for HEP Completed Comments   Supine ?? ?? ?? ?? ??   Quad sets 20x10" ?? x x ??   SLR 3x10 ?? x x Reinforce no lag   Hamstring sets 10x10" ?? x x ??   Glut sets 10x10" ?? x x ??   Prone ?? ?? ??  ??   Hip ext 2x15 Orange  x x 2 pillows under waist    Sidelying ?? ?? ??  ??   Rose hip abd 30x Orange  x x Use board   Clamshells  30x Orange  x x ??   Other:  ??  Specific Instructions for next treatment:   Continue working on table/core exercises.      Treatment Charges: Mins Units   [x]  Modalities: [place] 20 1   [x]  Ther Exercise 15 1   []  Manual Therapy     []  Ther Activities     []  Aquatics     [x]  Vasocompression 20 1   []  Other     Total Treatment time 55 3       Assessment: [x] Progressing toward goals. Continued working on [employer]. Able to add in orange band for resistance with clams, abd, and hip ext. No pain or increased symptoms. Vaso for swelling reduction and russian for quad re-education.     [] No change.     [] Other:  [x] Patient would continue to benefit from skilled physical therapy services in order to:  improve ROM, strength, and functional ability (running, exercising)    STG: (to be met in 12 treatments)  1. ? Pain: <2/10  2. ? ROM: of R knee  3. ? Strength: to at least 4/5 with all hip movements  4. ? Function: LEFI <60% interference  5. Patient to be independent with home exercise program as demonstrated by performance with correct form without cues.  ??  LTG: (to be met in 30 treatments)  1. No pain in R knee  2. Full AROM of R knee  3. 5/5 strength with all R LE movements  4. LEFI <10% interference  5. Able to run 2 miles  6. Able to exercise at least 3x/wk  ??                 Patient goals:"get to walking and running"    Pt. Education:  [x] Yes  [] No  [] Reviewed Prior HEP/Ed  Method of Education: [x] Verbal  [x] Demo  [] Written  Comprehension of Education:  [x] Verbalizes understanding.  [x] Demonstrates understanding.  []  Needs review.  [] Demonstrates/verbalizes HEP/Ed previously given.     Plan: [x] Continue current frequency toward long and short term goals.          Time In: 1:00pm            Time Out: 2:00pm    Electronically signed by:  Sanjuana Mae, PTA

## 2019-06-28 ENCOUNTER — Inpatient Hospital Stay: Admit: 2019-06-28 | Discharge: 2019-06-28 | Payer: PRIVATE HEALTH INSURANCE

## 2019-06-28 NOTE — Other (Signed)
[]  [place]  [address]  [phone]  F: [phone] []  [place]  [address]   [address]  P: [phone]  F: [phone] [x]  [place]  [place]  P: [phone]  F: [phone] []  [place]  [place]: [phone]  F: [phone]      Physical Therapy Daily Treatment Note    Date:  06/28/2019  Patient Name:  Shannon Jones    DOB:  1995-11-23  MRN: [id number]  Physician: Douglass Rivers, MD                 Insurance: [facility] (28 visits remain; no copay, ded met, 20%)  Medical Diagnosis: R internal derangement of knee                       Rehab Codes: M25.561, M25.552  Onset date: 05/26/19     Visit# / total visits: 4/12     Cancels/No Shows: 0/0    Subjective:    Pain:  []  Yes  [x]  No Location: R knee  Pain Rating: (0-10 scale) 0/10  Pain altered Tx:  [x]  No  []  Yes  Action:  Comments: Pt reports no R knee pain nor soreness this date.     Objective:  Modalities:   Treatment Location  Left      Right                          Position   knee [] ?          [x] ?  [x] ? Supine    [] ? Prone   [] ? Side lying  [] ? Sitting                                          ?? Treatment Modality   2 Vasocompression    34?? temp    Med pressure     15 min   2 Electrical Stim:    [] ? IFC     [] ? MFAC      [x] ? VMS (~30 mA)                                                    #Channels:  [x] ? 1        [] ? 2       [] ? Other:   1 Other:  ex                             Precautions: see protocol-  Exercises:  g  4-6 wks 10/2 advance weight bearing 25% weekly until full, PROM/AROM to tolerance, patellar and tib-fib mobs, QS, hamstring and glut sets, SLR, sidelying and core ex   8 wks 10/16: Begin gait training, begin closed chain, wall sits, shurttle, mini squats,  toe raises, L legged WB balance ex  Exercise Reps/  Time Weight/ Level Issued for HEP Completed Comments   Nu-Step 10' L1  x            TGym Squats/HR 3x10 L8  x    Supine ?? ?? ?? ?? ??   *Quad sets 20x10" ?? x x ??   *SLR 3x10 ?? x x Reinforce no lag   *Hamstring sets 10x10" ?? x x ??   *Glut sets 10x10" ?? x x ??   *Heel slides 20x AROM x x    Prone ?? ?? ??  ??   *Hip ext 2x15  x x 2 pillows under waist    Prone hip ext glut max 2x15   x 2 pillows under waist   Sidelying ?? ?? ??  ??   *Rose hip abd 30x Orange  x x Use board   *Clamshells  30x Orange  x x ??   Other: Patellar mobs M/L, Superior/inferior, PROM knee flexion/extension  ??  Specific Instructions for next treatment:  Alter-G walk when able to 50% WB.     Treatment Charges: Mins Units   [x]   Modalities: [place] 15 1   [x]   Ther Exercise 25 2   [x]   Manual Therapy 5 NC   []   Ther Activities     []   Aquatics     [x]   Vasocompression 15 1   []   Other     Total Treatment time 60 4       Assessment: [x]  Progressing toward goals. Progressed pt with warm up on Nu-Step to begin PWB of 25% with good pt tolerance. Added total gym squats at 25% WBing to promote ROM and light quad strengthening with good pt tolerance. Pt demonstrates very mild quad lag during SLR but has shown improvement in decreasing the lag. Addition of prone hip extension glut max for hip strengthening to stabilize RLE with use of two pillows under hips to prevent QL activation for compensation with good result. Pt states she is here next week and then the following week (week of October 5th) is she back home to see her doctor and has an appointment scheduled with her PT out there. When pt returns starting week of October 12th, pt will be seen at the [facility] clinic.    []  No change.     []  Other:  [x]  Patient would continue to benefit from skilled physical therapy services in order to:  improve ROM, strength, and functional ability (running, exercising)    STG: (to be met in 12 treatments)  1. ? Pain: <2/10  2. ?  ROM: of R knee  3. ? Strength: to at least 4/5 with all hip movements  4. ? Function: LEFI <60% interference  5. Patient to be independent with home exercise program as demonstrated by performance with correct form without cues.  ??  LTG: (to be met in 30 treatments)  1. No pain in R knee  2. Full AROM of R knee  3. 5/5 strength with all R LE movements  4. LEFI <10% interference  5. Able to run 2 miles  6. Able to exercise at least 3x/wk  ??                 Patient goals:"get to walking and running"    Pt. Education:  [x]  Yes  []  No  []  Reviewed Prior HEP/Ed  Method of Education: [x]  Verbal  [x]  Demo  []  Written  Comprehension of Education:  [x]  Verbalizes understanding.  [x]  Demonstrates  understanding.  []  Needs review.  []  Demonstrates/verbalizes HEP/Ed previously given.     Plan: [x]  Continue current frequency toward long and short term goals.  Pt is here next week and then the following week (week of October 5th) is she back home to see her doctor and has an appointment scheduled with her PT out there. When pt returns starting week of October 12th, pt will be seen at the [facility] clinic        Time In: 2:05pm            Time Out: 3:15pm    Electronically signed by:  Arlana Lindau, PTA

## 2019-07-03 ENCOUNTER — Inpatient Hospital Stay: Admit: 2019-07-03 | Discharge: 2019-07-03 | Payer: PRIVATE HEALTH INSURANCE

## 2019-07-03 NOTE — Other (Signed)
[]  [place]  [address].  [phone]  F: [phone] []  [place]  [address]   [address]  P: [phone]  F: [phone] [x]  [place]  [place]  P: [phone]  F: [phone] []  [place]  [place]: [phone]  F: [phone]      Physical Therapy Daily Treatment Note    Date:  07/03/2019  Patient Name:  Shannon Jones    DOB:  02-Mar-1996  MRN: [id number]  Physician: Douglass Rivers, MD                 Insurance: [facility] (28 visits remain; no copay, ded met, 20%)  Medical Diagnosis: R internal derangement of knee                       Rehab Codes: M25.561, M25.552  Onset date: 05/26/19     Visit# / total visits: 5/12     Cancels/No Shows: 0/0    Subjective:    Pain:  []  Yes  [x]  No Location: R knee  Pain Rating: (0-10 scale) 0/10  Pain altered Tx:  [x]  No  []  Yes  Action:  Comments: Pt reports without any R knee pain or soreness this date.     Objective:  Modalities:   Treatment Location  Left      Right                          Position   knee [] ?          [x] ?  [x] ? Supine    [] ? Prone   [] ? Side lying  [] ? Sitting                                          ?? Treatment Modality   2 Vasocompression    34?? temp    Med pressure     15 min   2 Electrical Stim:    [] ? IFC     [] ? MFAC      [x] ? VMS (~30 mA)                                                    #Channels:  [x] ? 1        [] ? 2       [] ? Other:   1 Other:  ex                             Precautions: see protocol-  Exercises:  g  4-6 wks 10/2 advance weight bearing 25% weekly until full, PROM/AROM to tolerance, patellar and tib-fib mobs, QS, hamstring and glut sets, SLR, sidelying and core ex   8 wks 10/16: Begin gait training, begin closed chain, wall sits, shurttle, mini  squats, toe raises, L legged WB balance ex  Exercise  Reps/ Time Weight/ Level Issued for HEP Completed Comments   Nu-Step/Sci fit  10' L1  x            TGym Squats/HR 3x10 L8  x    Supine ?? ?? ?? ?? ??   *Quad sets 20x10" ?? x x ??   *SLR 3x10 ?? x x Reinforce no lag   *Hamstring sets 10x10" ?? x x ??   *Glut sets 10x10" ?? x x ??   *Heel slides 20x AROM x x    Prone ?? ?? ??  ??   *Hip ext 2x15  x x 2 pillows under waist    Prone hip ext glut max 2x15   x 2 pillows under waist   Sidelying ?? ?? ??  ??   *Rose hip abd 30x Orange  x  Use board   *Clamshells  30x Orange  x x    Other: Patellar mobs M/L, Superior/inferior, PROM knee flexion/extension  ??  Specific Instructions for next treatment:  Alter-G walk when able to 50% WB.     Treatment Charges: Mins Units   [x]   Modalities: [place] 15 1   [x]   Ther Exercise 25 2   [x]   Manual Therapy 5 NC   []   Ther Activities     []   Aquatics     [x]   Vasocompression 15 1   []   Other     Total Treatment time 60 4       Assessment: [x]  Progressing toward goals. Initiated session with scit fit followed by mat exercises. Cont with exercise program listed above, pt with good tolerance. Pt demonstrated good control and decreased lag with SLR's this date. Instructed on 25% weight bearing status to ensure pt understanding. Ended with vaso to decrease muscle soreness. Progress per pt tol.     []  No change.     []  Other:  [x]  Patient would continue to benefit from skilled physical therapy services in order to:  improve ROM, strength, and functional ability (running, exercising)    STG: (to be met in 12 treatments)  1. ? Pain: <2/10  2. ? ROM: of R knee  3. ? Strength: to at least 4/5 with all hip movements  4. ? Function: LEFI <60% interference  5. Patient to be independent with home exercise program as demonstrated by performance with correct form without cues.  ??  LTG: (to be met in 30 treatments)  1. No pain in R knee  2. Full AROM of R knee  3. 5/5 strength with all R LE movements  4. LEFI <10%  interference  5. Able to run 2 miles  6. Able to exercise at least 3x/wk  ??                 Patient goals:"get to walking and running"    Pt. Education:  [x]  Yes  []  No  []  Reviewed Prior HEP/Ed  Method of Education: [x]  Verbal  [x]  Demo  []  Written  Comprehension of Education:  [x]  Verbalizes understanding.  [x]  Demonstrates understanding.  []  Needs review.  []  Demonstrates/verbalizes HEP/Ed previously given.     Plan: [x]  Continue current frequency toward long and short term goals.  Pt is here next week and then the following week (week of October 5th) is she back home to see her doctor and has an appointment scheduled with her PT out there. When pt returns starting week of October 12th, pt will be seen at the [facility]  clinic        Time In: 1:05pm            Time Out:  2:15 pm    Electronically signed by:  Bradd Burner, PTA

## 2019-07-05 ENCOUNTER — Inpatient Hospital Stay: Admit: 2019-07-05 | Discharge: 2019-07-05 | Payer: PRIVATE HEALTH INSURANCE

## 2019-07-05 NOTE — Other (Signed)
[]  [place]  [address]  [phone]  F: [phone] []  [place]  [address]   [address]  P: [phone]  F: [phone] [x]  [place]  [place]  P: [phone]  F: [phone] []  [facility]  Outpatient Rehabilitation &  Therapy  [place]   [address]   [place]: [phone]  F: [phone]      Physical Therapy Daily Treatment Note    Date:  07/05/2019  Patient Name:  Shannon Jones    DOB:  25-Apr-1996  MRN: [id number]  Physician: Douglass Rivers, MD                 Insurance: [facility] (28 visits remain; no copay, ded met, 20%)  Medical Diagnosis: R internal derangement of knee                       Rehab Codes: M25.561, M25.552  Onset date: 05/26/19     Visit# / total visits: 6/12     Cancels/No Shows: 0/0    Subjective:    Pain:  []  Yes  [x]  No Location: R knee  Pain Rating: (0-10 scale) 0/10  Pain altered Tx:  [x]  No  []  Yes  Action:  Comments: Pt reports mild muscular soreness from last visit but no pain in R knee.     Objective:  Modalities:   Treatment Location  Left      Right                          Position   knee [] ?          [x] ?  [x] ? Supine    [] ? Prone   [] ? Side lying  [] ? Sitting                                          ?? Treatment Modality   2 Vasocompression    34?? temp    Med pressure     15 min    Electrical Stim:    [] ? IFC     [] ? MFAC      [x] ? VMS (~30 mA)                                                    #Channels:  [x] ? 1        [] ? 2       [] ? Other:   1 Other:  ex                             Precautions: see protocol-  Exercises:  g  4-6 wks 10/2 advance weight bearing 25% weekly until full, PROM/AROM to tolerance, patellar and tib-fib mobs, QS, hamstring and glut sets, SLR, sidelying and core ex  (50% WB started 9/30)  8 wks 10/16: Begin gait training, begin closed  chain, wall sits, shurttle, mini squats, toe raises,  L legged WB balance ex  Exercise Reps/ Time Weight/ Level Issued for HEP Completed Comments   Nu-Step 10' L4  x            TGym Squats/HR 3x15 L15  x    TKE 20x5" Blueberry X-9/30 x    Supine ?? ?? ??  ??   *Quad sets 20x10" ?? x x ??   *SLR 3x15 ?? x x Reinforce no lag   *Hamstring sets 10x10" ?? x x ??   *Heel slides 25x AAROM x x    Strap gastroc stretch 3x30"  X-9/30 x    DF/PF 30x ea Blueberry X-9/30 x    Prone ?? ?? ??  ??   *Hip ext 2x15  x x 2 pillows under waist    Prone hip ext glut max 2x15   x 2 pillows under waist   Sidelying ?? ?? ??  ??   *Rose hip abd 30x Lime  x x Use board   *Clamshells  30x Lime  x x    Other: Patellar mobs M/L, Superior/inferior, PROM knee flexion/extension  ??  R knee AROM 07/05/19: 0-115    Specific Instructions for next treatment:  Alter-G walk next visit    Treatment Charges: Mins Units   []   Modalities: [place]     [x]   Ther Exercise 40 3   [x]   Manual Therapy 5 NC   []   Ther Activities     []   Aquatics     [x]   Vasocompression 15 1   []   Other     Total Treatment time 60 4       Assessment: [x]  Progressing toward goals. Able to progress pt to 50% WBing today, including increased level on total gym and addition of TKE for continued quad strengthening with good pt tolerance. Min v/c during TKE to maintain 50% WBing status with good result. D/C russian d/t pt demonstrating SLR with no quad lag. Added resisted PF/DF for gastroc and anterior tib strengthening to allow for stability for pt return to walk/as pt progresses WBing status. Will plan to trial Alter-G walking next visit.     []  No change.     []  Other:  [x]  Patient would continue to benefit from skilled physical therapy services in order to:  improve ROM, strength, and functional ability (running, exercising)    STG: (to be met in 12 treatments)  1. ? Pain: <2/10  2. ? ROM: of R knee  3. ? Strength: to at least 4/5 with all hip movements  4. ? Function: LEFI <60%  interference  5. Patient to be independent with home exercise program as demonstrated by performance with correct form without cues.  ??  LTG: (to be met in 30 treatments)  1. No pain in R knee  2. Full AROM of R knee  3. 5/5 strength with all R LE movements  4. LEFI <10% interference  5. Able to run 2 miles  6. Able to exercise at least 3x/wk  ??                 Patient goals:"get to walking and running"    Pt. Education:  [x]  Yes  []  No  [x]  Reviewed Prior HEP/Ed: Added TKE and supplied pt with blueberry t-band, lime t-band for sidelying exercises   Method of Education: [x]  Verbal  [x]  Demo  []  Written  Comprehension of Education:  [x]  Verbalizes understanding.  [x]  Demonstrates understanding.  []  Needs review.  [  x] Demonstrates/verbalizes HEP/Ed previously given.     Plan: [x]  Continue current frequency toward long and short term goals.       Time In: 1:00pm            Time Out:  2:15pm    Electronically signed by:  Arlana Lindau, PTA

## 2019-07-10 ENCOUNTER — Inpatient Hospital Stay: Admit: 2019-07-10 | Discharge: 2019-07-10 | Payer: PRIVATE HEALTH INSURANCE

## 2019-07-10 ENCOUNTER — Encounter

## 2019-07-10 DIAGNOSIS — M2391 Unspecified internal derangement of right knee: Secondary | ICD-10-CM

## 2019-07-10 NOTE — Other (Signed)
[]  [place]  [address]  [phone]  F: [phone] []  [place]  [address]   [address]  P: [phone]  F: [phone] [x]  [place]  [place]  P: [phone]  F: [phone] []  [place]  [place]: [phone]  F: [phone]      Physical Therapy Daily Treatment Note    Date:  07/10/2019  Patient Name:  Shannon Jones    DOB:  1996/07/11  MRN: [id number]  Physician: Douglass Rivers, MD                 Insurance: [facility] (28 visits remain; no copay, ded met, 20%)  Medical Diagnosis: R internal derangement of knee                       Rehab Codes: M25.561, M25.552  Onset date: 05/26/19      Next MD appt: 10/7 (virtual)  Visit# / total visits: 7/12   Cancels/No Shows: 0/0    Subjective:    Pain:  []  Yes  [x]  No Location: R knee  Pain Rating: (0-10 scale) 0/10  Pain altered Tx:  [x]  No  []  Yes  Action:  Comments: Pt reports mild muscular soreness from last visit but no pain in R knee.     Objective:  Modalities:   Treatment Location  Left      Right                          Position   knee []           [x]   [x]  Supine    []  Prone   []  Side lying  []  Sitting          Treatment Modality   2 Vasocompression    34?? temp    Med pressure     21mn   1 Other:  ex                         Precautions: see protocol-  Exercises:  g  4-6 wks 10/2 advance weight bearing 25% weekly until full, PROM/AROM to tolerance, patellar and tib-fib mobs, QS, hamstring and glut sets, SLR, sidelying and core ex  (50% WB started 9/30)  8 wks 10/16: Begin gait training, begin closed chain, wall sits, shurttle, mini squats, toe raises, L legged WB balance ex  Exercise Reps/ Time Weight/ Level Issued for HEP Completed Comments   Alter G      2XS short,carriage =7     Walking forward 10' 1.8 mph  x 50% WB    Walking retro 5' 0.7 mph  x    R squats/HR 2x10   x    4 way hip  2x10   x R in CKC   TGym lateral squats 2x15 L13  x R only   TGym Squats/HR R leg 3x10 L15   x (40% WB at L15)   TKE 20x5" Blueberry X x    Supine ?? ?? ??  ??   *Quad sets 20x10" ?? x  ??   *SLR 3x15 ?? x  Reinforce no lag   *Hamstring sets 10x10" ?? x  ??   *Heel slides 25x AAROM x     Strap gastroc stretch 3x30"  X-9/30     DF/PF 30x ea Blueberry X-9/30     Prone ?? ?? ??  ??   *Hip ext 2x15  x  2 pillows under waist    Prone hip ext glut max 2x15    2 pillows under waist   Sidelying ?? ?? ??  ??   *Rose hip abd 30x Lime  x  Use board   *Clamshells  30x Lime  x     Other: Patellar mobs M/L, Superior/inferior, PROM knee flexion/extension  ??  R knee AROM 07/05/19: 0-115    Specific Instructions for next treatment:      Treatment Charges: Mins Units   []   Modalities: [place]     [x]   Ther Exercise 40 3   []   Manual Therapy     []   Ther Activities     []   Aquatics     [x]   Vasocompression 15 1   []   Other     Total Treatment time 55 4       Assessment: [x]  Progressing toward goals. Able to add Alter G at 50% WB which was confirmed via forceplate data.  Ambulated forward, retro, 1 legged squats and heel raises.  Total Gym exercises were at 35-40% body weight.  No pain with any ex noted.      []  No change.     []  Other:  [x]  Patient would continue to benefit from skilled physical therapy services in order to:  improve ROM, strength, and functional ability (running, exercising)    STG: (to be met in 12 treatments)  1. ? Pain: <2/10  2. ? ROM: of R knee  3. ? Strength: to at least 4/5 with all hip movements  4. ? Function: LEFI <60% interference  5. Patient to be independent with home exercise program as demonstrated by performance with correct form without cues.  ??  LTG: (to be met in 30 treatments)  1. No pain in R knee  2. Full AROM of R knee  3. 5/5 strength with all R LE movements  4. LEFI <10% interference  5. Able to run 2  miles  6. Able to exercise at least 3x/wk  ??                 Patient goals:"get to walking and running"    Pt. Education:  [x]  Yes  []  No  [x]  Reviewed Prior HEP/Ed:  Method of Education: [x]  Verbal  [x]  Demo  []  Written  Comprehension of Education:  [x]  Verbalizes understanding.  [x]  Demonstrates understanding.  []  Needs review.  [x]  Demonstrates/verbalizes HEP/Ed previously given.     Plan: [x]  Continue current frequency toward long and short term goals.       Time In: 1400            Time Out:  1500    Electronically signed by:  Hadley Pen, PT

## 2019-07-12 ENCOUNTER — Inpatient Hospital Stay: Admit: 2019-07-12 | Discharge: 2019-07-12 | Payer: PRIVATE HEALTH INSURANCE

## 2019-07-12 ENCOUNTER — Encounter

## 2019-07-12 NOTE — Other (Signed)
[]  [place]  [address]  [phone]  F: [phone] []  [place]  [address]   [address]  P: [phone]  F: [phone] [x]  [place]  [place]  P: [phone]  F: [phone] []  [facility]  Outpatient Rehabilitation &  Therapy  [place]   [address]   [place]: [phone]  F: [phone]      Physical Therapy Daily Treatment Note    Date:  07/12/2019  Patient Name:  Shannon Jones    DOB:  [birth year]  MRN: [id number]  Physician: Douglass Rivers, MD                 Insurance: [facility] (28 visits remain; no copay, ded met, 20%)  Medical Diagnosis: R internal derangement of knee                       Rehab Codes: M25.561, M25.552  Onset date: 05/26/19      Next MD appt: Thanksgiving break  Visit# / total visits: 8/12   Cancels/No Shows: 0/0    Subjective:    Pain:  []  Yes  [x]  No Location: R knee  Pain Rating: (0-10 scale) 0/10  Pain altered Tx:  [x]  No  []  Yes  Action:  Comments: Pt reports no issues from last visit.  Pain remains not present. Had her virtual visit with Dr. Prudy Feeler and she said she was in the Alter G.      Objective:  Modalities:   Treatment Location  Left      Right                          Position   knee []           [x]   [x]  Supine    []  Prone   []  Side lying  []  Sitting          Treatment Modality   2 Vasocompression    34?? temp    Med pressure     79mn   1 Other:  ex                         Precautions: see protocol-  Exercises:  g  4-6 wks 10/2 advance weight bearing 25% weekly until full, PROM/AROM to tolerance, patellar and tib-fib mobs, QS, hamstring and glut sets, SLR, sidelying and core ex  (50% WB started 9/30)  8 wks 10/16: Begin gait training, begin closed chain, wall sits, shurttle, mini squats, toe raises, L legged WB balance ex  Exercise Reps/ Time Weight/  Level Issued for HEP Completed Comments   Alter G      2XS short,carriage =7    Walking forward 10' 2.2 mph  x 50% WB    Walking retro 5' 1.069m  x    R squats/HR 2x15   x    4 way hip  2x15   x R in CKC   TGym lateral squats 2x15 L15  x R only   TGym Squats/HR R leg 3x10 L17  x (45% WB at L17)   TKE 20x5" purple X x    Supine ?? ?? ??  ??   *  Quad sets 20x10" ?? x  ??re-enforced for HEP   *SLR 3x15 ?? x  Reinforce no lag   *Hamstring sets 10x10" ?? x  ??   *Heel slides 25x AAROM x     Strap gastroc stretch 3x30"  X-9/30     DF/PF 30x ea Blueberry X-9/30     Prone ?? ?? ??  ??   *Hip ext 2x15  x  2 pillows under waist    Prone hip ext glut max 2x15    2 pillows under waist   Sidelying ?? ?? ??  ??   *Rose hip abd 30x Lime  x  Use board   *Clamshells  30x Lime  x     Other: ??  R knee AROM 10/7//20: 2-112    Specific Instructions for next treatment:  Increase to 75% WB next visit for 2 visits and then 100% as of 10/16.  Add quad sets to program    Treatment Charges: Mins Units   []   Modalities: [place]     [x]   Ther Exercise 40 3   []   Manual Therapy     []   Ther Activities     []   Aquatics     [x]   Vasocompression 15 1   []   Other     Total Treatment time 55 4       Assessment: [x]  Progressing toward goals.She is progressing very well.  Her quad strength remains diminished.  Reinforced quad sets to maintain full knee extension.      []  No change.     []  Other:  [x]  Patient would continue to benefit from skilled physical therapy services in order to:  improve ROM, strength, and functional ability (running, exercising)    STG: (to be met in 12 treatments)  1. ? Pain: <2/10  2. ? ROM: of R knee  3. ? Strength: to at least 4/5 with all hip movements  4. ? Function: LEFI <60% interference  5. Patient to be independent with home exercise program as demonstrated by performance with correct form without cues.  ??  LTG: (to be met in 30 treatments)  1. No pain in R knee  2. Full AROM of R knee  3. 5/5 strength with all R LE movements  4. LEFI  <10% interference  5. Able to run 2 miles  6. Able to exercise at least 3x/wk  ??                 Patient goals:"get to walking and running"    Pt. Education:  [x]  Yes  []  No  [x]  Reviewed Prior HEP/Ed:  Method of Education: [x]  Verbal  [x]  Demo  []  Written  Comprehension of Education:  [x]  Verbalizes understanding.  [x]  Demonstrates understanding.  []  Needs review.  [x]  Demonstrates/verbalizes HEP/Ed previously given.     Plan: [x]  Continue current frequency toward long and short term goals.       Time In: 1503  Time Out:  1600    Electronically signed by:  Hadley Pen, PT

## 2019-07-17 ENCOUNTER — Inpatient Hospital Stay: Admit: 2019-07-17 | Discharge: 2019-07-17 | Payer: PRIVATE HEALTH INSURANCE

## 2019-07-17 NOTE — Other (Signed)
[]  [place]  [address]  [phone]  F: [phone] []  [place]  [address]   [address]  P: [phone]  F: [phone] [x]  [place]  Outpatient Rehabilitation &  Therapy  [place]  P: [phone]  F: [phone] []  [place]  [place]   [address]   [place]: [phone]  F: [phone]      Physical Therapy Daily Treatment Note    Date:  07/17/2019  Patient Name:  Shannon Jones    DOB:  16-Nov-1995  MRN: [id number]  Physician: Douglass Rivers, MD                 Insurance: [facility] (28 visits remain; no copay, ded met, 20%)  Medical Diagnosis: R internal derangement of knee                       Rehab Codes: M25.561, M25.552  Onset date: 05/26/19      Next MD appt: Thanksgiving break  Visit# / total visits: 9/12   Cancels/No Shows: 0/0    Subjective:    Pain:  []  Yes  [x]  No Location: R knee  Pain Rating: (0-10 scale) 0/10  Pain altered Tx:  [x]  No  []  Yes  Action:  Comments: Pt reports no pain.  Forgot her brace at home today    Objective:  Modalities:   Treatment Location  Left      Right                          Position   knee []           [x]   [x]  Supine    []  Prone   []  Side lying  []  Sitting          Treatment Modality   2 Vasocompression    34?? temp    Med pressure     73mn   1 Other:  ex                         Precautions: see protocol-  Exercises:  g  4-6 wks 10/2 advance weight bearing 25% weekly until full, PROM/AROM to tolerance, patellar and tib-fib mobs, QS, hamstring and glut sets, SLR, sidelying and core ex  (50% WB started 9/30)  8 wks 10/16: Begin gait training, begin closed chain, wall sits, shurttle, mini squats, toe raises, L legged WB balance ex  Exercise Reps/ Time Weight/ Level Issued for HEP Completed Comments   Alter G      2XS short,carriage =7    Walking  forward 10' 2.5 mph  x 75% WB    Walking retro 5' 1.2 mph  x    Standing quad sets 10x10"   x    R squats/HR 2x15   x    4 way hip  2x15   x R in CKC   TGym lateral squats 2x15 L15  x R only   TGym Squats/HR R leg 3x10 L17  x (45% WB at L17)   TKE 20x5" purple X x    Supine ?? ?? ??  ??   *Quad sets 20x10" ?? x  ??re-enforced for  HEP   *SLR 3x15 ?? x  Reinforce no lag   *Hamstring sets 10x10" ?? x  ??   *Heel slides 25x AAROM x     Strap gastroc stretch 3x30"  X-9/30     DF/PF 30x ea Blueberry X-9/30     Prone ?? ?? ??  ??   *Hip ext 2x15  x  2 pillows under waist    Prone hip ext glut max 2x15    2 pillows under waist   Sidelying ?? ?? ??  ??   *Rose hip abd 30x Lime  x  Use board   *Clamshells  30x Lime  x     Other: ??  R knee AROM 10/7//20: 0-112 with a strong quad contraction    Specific Instructions for next treatment:  Increase to 75% WB next visit for 1 more visit and then 100% as of 10/16.  Add quad sets to program    Treatment Charges: Mins Units   []   Modalities: [place]     [x]   Ther Exercise 35 3   []   Manual Therapy     []   Ther Activities     []   Aquatics     [x]   Vasocompression 15 1   []   Other     Total Treatment time 50 3       Assessment: [x]  Progressing toward goals.She is progressing very well.  Her quad strength  Has improved to the point that she was able to achieve 0 deg knee ext.  No pain in the Alter G with ambulation and ex. She also does not demonstrate any gait deviations.  Normal scar mobility.      []  Other:  [x]  Patient would continue to benefit from skilled physical therapy services in order to:  improve ROM, strength, and functional ability (running, exercising)    STG: (to be met in 12 treatments)  1. ? Pain: <2/10  2. ? ROM: of R knee  3. ? Strength: to at least 4/5 with all hip movements  4. ? Function: LEFI <60% interference  5. Patient to be independent with home exercise program as demonstrated by performance with correct form without cues.  ??  LTG: (to be met in 30 treatments)  1. No pain in  R knee  2. Full AROM of R knee  3. 5/5 strength with all R LE movements  4. LEFI <10% interference  5. Able to run 2 miles  6. Able to exercise at least 3x/wk  ??                 Patient goals:"get to walking and running"    Pt. Education:  [x]  Yes  []  No  [x]  Reviewed Prior HEP/Ed:  Method of Education: [x]  Verbal  [x]  Demo  []  Written  Comprehension of Education:  [x]  Verbalizes understanding.  [x]  Demonstrates understanding.  []  Needs review.  [x]  Demonstrates/verbalizes HEP/Ed previously given.     Plan: [x]  Continue current frequency toward long and short term goals.       Time In: 1530  Time Out:  1620    Electronically signed by:  Hadley Pen, PT

## 2019-07-18 ENCOUNTER — Inpatient Hospital Stay

## 2019-07-19 ENCOUNTER — Encounter

## 2019-07-19 NOTE — Other (Signed)
[]   [place]  [address]    [phone]  F: [phone]   []  [place]  [address]   [address]  P: [phone]  F: [phone]  [x]  [place]  [place]  P: [phone]  F: [phone] []  [place]  P: [phone]  F: [phone]  []  [place]  [place]   [address]   [place]: [phone]  F: [phone]   []  [place]  [address]  [place]: [phone]   F: [phone]     Physical Therapy Cancel/No Show note    Date: 07/19/2019  Patient: Shannon Jones  DOB: [birth year]  MRN: [id number]    Cancels/No Shows to date: 0/1    For today's appointment patient:    []   Cancelled    []  Rescheduled appointment    [x]  No-show     Reason given by patient:    []   Patient ill    []   Conflicting appointment    []  No transportation      []  Conflict with work    []  No reason given    []  Weather related    []  COVID-19    []  Other:      Comments:        []  Next appointment was confirmed    Electronically signed by: Hadley Pen, PT

## 2019-07-20 ENCOUNTER — Inpatient Hospital Stay: Payer: PRIVATE HEALTH INSURANCE

## 2019-07-20 ENCOUNTER — Inpatient Hospital Stay: Admit: 2019-07-20 | Discharge: 2019-07-20 | Payer: PRIVATE HEALTH INSURANCE

## 2019-07-20 NOTE — Other (Signed)
[]  [place]  [address]  [phone]  F: [phone] []  [place]  [address]   [address]  P: [phone]  F: [phone] [x]  [place]  [place]  P: [phone]  F: [phone] []  [facility]  Outpatient Rehabilitation &  Therapy  [place]   [address]   [place]: [phone]  F: [phone]      Physical Therapy Daily Treatment Note    Date:  07/20/2019  Patient Name:  Shannon Jones    DOB:  Jul 28, 1996  MRN: [id number]  Physician: Douglass Rivers, MD                 Insurance: [facility] (28 visits remain; no copay, ded met, 20%)  Medical Diagnosis: R internal derangement of knee                       Rehab Codes: M25.561, M25.552  Onset date: 05/26/19      Next MD appt: Thanksgiving break  Visit# / total visits: 10/12   Cancels/No Shows: 0/0    Subjective:    Pain:  []  Yes  [x]  No Location: R knee  Pain Rating: (0-10 scale) 0/10  Pain altered Tx:  [x]  No  []  Yes  Action:  Comments: Pt reports no pain.  Forgot her brace at home today    Objective:  Modalities:   Treatment Location  Left      Right                          Position   knee []           [x]   [x]  Supine    []  Prone   []  Side lying  []  Sitting          Treatment Modality   -- Vasocompression    34?? temp    Med pressure     38mn   1 Other:  ex                         Precautions: see protocol-  Exercises:  g  4-6 wks 10/2 advance weight bearing 25% weekly until full, PROM/AROM to tolerance, patellar and tib-fib mobs, QS, hamstring and glut sets, SLR, sidelying and core ex    8 wks 10/16: Begin gait training, begin closed chain, wall sits, shurttle, mini squats, toe raises, L legged WB balance ex  Exercise Reps/ Time Weight/ Level Issued for HEP Completed Comments   Alter G      2XS short,carriage =7    Walking forward 10' 2.5 mph  x  75% WB    Walking retro 5' 1.5 mph  x    Standing quad sets 10x10"   x    R squats/HR 2x15   x    4 way hip  2x15   x R in CKC   TGym lateral squats 3x15 L20  x R only   TGym Squats/HR R leg 3x15 L20  x (50% WB at L20)   TKE 20x5" purple X x    Supine ?? ?? ??  ??   *Quad sets 20x10" ?? x  ??re-enforced for HEP   *  SLR 3x15 ?? x  Reinforce no lag   *Hamstring sets 10x10" ?? x  ??   *Heel slides 25x AAROM x     Strap gastroc stretch 3x30"  X-9/30     DF/PF 30x ea [place] X-9/30     Prone ?? ?? ??  ??   *Hip ext 2x15  x  2 pillows under waist    Prone hip ext glut max 2x15    2 pillows under waist   Sidelying ?? ?? ??  ??   *Rose hip abd 30x Lime  x  Use board   *Clamshells  30x Lime  x     Other: ??  R knee AROM 10/7//20: 0-112 with a strong quad contraction    Specific Instructions for next treatment:  Increase to 100% as of 10/16.      Treatment Charges: Mins Units   []   Modalities: [place]     [x]   Ther Exercise 40 3   []   Manual Therapy     []   Ther Activities     []   Aquatics     []   Vasocompression     []   Other     Total Treatment time 40 3       Assessment: [x]  Progressing toward goals.She is progressing very well. No pain with the program.       []  Other:  [x]  Patient would continue to benefit from skilled physical therapy services in order to:  improve ROM, strength, and functional ability (running, exercising)    STG: (to be met in 12 treatments)  1. ? Pain: <2/10  2. ? ROM: of R knee  3. ? Strength: to at least 4/5 with all hip movements  4. ? Function: LEFI <60% interference  5. Patient to be independent with home exercise program as demonstrated by performance with correct form without cues.  ??  LTG: (to be met in 30 treatments)  1. No pain in R knee  2. Full AROM of R knee  3. 5/5 strength with all R LE movements  4. LEFI <10% interference  5. Able to run 2 miles  6. Able to exercise at least 3x/wk  ??                 Patient goals:"get to walking and running"    Pt. Education:  [x]  Yes  []  No  [x]  Reviewed Prior  HEP/Ed:  Method of Education: [x]  Verbal  [x]  Demo  []  Written  Comprehension of Education:  [x]  Verbalizes understanding.  [x]  Demonstrates understanding.  []  Needs review.  [x]  Demonstrates/verbalizes HEP/Ed previously given.     Plan: [x]  Continue current frequency toward long and short term goals.       Time In: 1200  Time Out:  1245    Electronically signed by:  Hadley Pen, PT

## 2019-07-24 ENCOUNTER — Inpatient Hospital Stay: Admit: 2019-07-24 | Discharge: 2019-07-24 | Payer: PRIVATE HEALTH INSURANCE

## 2019-07-24 NOTE — Other (Signed)
[]  [place]  [address]  [phone]  F: [phone] [x]  [place]  P: [phone]  F: [phone] []  [place]  [place]  P: [phone]  F: [phone] []  [facility]  Outpatient Rehabilitation &  Therapy  [place]   [address]   [place]: [phone]  F: [phone]      Physical Therapy Daily Treatment Note    Date:  07/24/2019  Patient Name:  Shannon Jones    DOB:  [birth year]  MRN: [id number]  Physician: Douglass Rivers, MD                 Insurance: [facility] (28 visits remain; no copay, ded met, 20%)  Medical Diagnosis: R internal derangement of knee                       Rehab Codes: M25.561, M25.552  Onset date: 05/26/19      Next MD appt: Thanksgiving break  Visit# / total visits: 11/12   Cancels/No Shows: 0/0    Subjective:    Pain:  []  Yes  [x]  No Location: R knee  Pain Rating: (0-10 scale) 0/10  Pain altered Tx:  [x]  No  []  Yes  Action:  Comments: Pt reports no pain nor discomfort in R knee today.    Objective:  Modalities:   Treatment Location  Left      Right                          Position   knee []           [x]   [x]  Supine    []  Prone   []  Side lying  []  Sitting          Treatment Modality   -- Vasocompression    34?? temp    Med pressure     30mn   1 Other:  ex                         Precautions: see protocol-  Exercises:  8 wks 10/16: Begin gait training, begin closed chain, wall sits, shuttle, mini squats, toe raises, L legged WB balance ex  Exercise Reps/ Time Weight/ Level Issued for HEP Completed Comments   Bike 7' Seat 2 x x            Wedge Gastroc Stretch 3x30"  x x    Stool HS Stretch 3x30"  x x            4-way hip  2x15 Orange x x R in CKC   Leg Press Squats/HR R leg 3x15 10#  x Increase wt next   TKE 20x5" Grey x x    Low stool hover 20x3"   x    Single leg RDL 3x  3 cones  x    3-way heel tap 10x ea   x    CKC lunges 3x10   x RLE leads only   Supine ?? ?? ??  ??   *Quad set+SLR 3x15 ?? x x Reinforce no lag   *Hamstring sets 10x10" ?? x x ??   Bridges 3x10  x x  Prone ?? ?? ??  ??   *Hip ext 2x15  x x 2 pillows under waist    Prone hip ext glut max 2x15  x x 2 pillows under waist   Sidelying ?? ?? ??  ??   *Rose hip abd 30x Blue x x Use board   *Clamshells  30x Blue  x x    Other: ??  R knee AROM 10/19//20: 0-130 with a strong quad contraction    Specific Instructions for next treatment:  TM walking next visit for gait training, SLS Rebounder    Treatment Charges: Mins Units   []   Modalities: [place]     [x]   Ther Exercise 55 4   []   Manual Therapy     []   Ther Activities     []   Aquatics     []   Vasocompression     []   Other     Total Treatment time 55 4       Assessment: [x]  Progressing toward goals. Able to initiate FWB exercises in CKC this date per protocol with good pt tolerance. Pt able to perform SLR with good visible quad contraction and no quad lag. Pt demonstrated weakness during 3-way heel taps with visible quad quivering but no pain nor discomfort. Min v/c and demonstration for new exercise with good result. Min v/c and tactile cuing during heel taps to prevent R knee valgus and use of mirror for visual cuing with good carryover to pt. Verbally and physically reviewed HEP and provided pt with updated copy with pt able to demonstrate understanding. Pt noted feeling muscular fatigue but no pain upon completion of therapy.      []  Other:  [x]  Patient would continue to benefit from skilled physical therapy services in order to:  improve ROM, strength, and functional ability (running, exercising)    STG: (to be met in 12 treatments)  1. ? Pain: <2/10  2. ? ROM: of R knee  3. ? Strength: to at least 4/5 with all hip movements  4. ? Function: LEFI <60% interference  5. Patient to be independent with home exercise program as demonstrated by performance with correct form without  cues.  ??  LTG: (to be met in 30 treatments)  1. No pain in R knee  2. Full AROM of R knee  3. 5/5 strength with all R LE movements  4. LEFI <10% interference  5. Able to run 2 miles  6. Able to exercise at least 3x/wk  ??                 Patient goals:"get to walking and running"    Pt. Education:  [x]  Yes  []  No  [x]  Reviewed Prior HEP/Ed: Provided pt with updated bands and updated exercises  Method of Education: [x]  Verbal  [x]  Demo  [x]  Written: Copy in chart  Comprehension of Education:  [x]  Verbalizes understanding.  [x]  Demonstrates understanding.  []  Needs review.  [x]  Demonstrates/verbalizes HEP/Ed previously given.     Plan: [x]  Continue current frequency toward long and short term goals.       Time In: 1:30pm  Time Out:  2:42pm    Electronically signed by:  Arlana Lindau, PTA

## 2019-07-26 ENCOUNTER — Inpatient Hospital Stay: Admit: 2019-07-26 | Discharge: 2019-07-26 | Payer: PRIVATE HEALTH INSURANCE

## 2019-07-26 NOTE — Other (Signed)
_0  [place]  [address]  [phone]  F: [phone] _1  [place]  P: [phone]  F: [phone] _2  [place]  [place]  P: [phone]  F: [phone] _3  [facility]  Outpatient Rehabilitation &  Therapy  [place]   [address]   [place]: [phone]  F: [phone]      Physical Therapy Daily Treatment Note    Date:  07/26/2019  Patient Name:  Shannon Jones    DOB:  [birth year]  MRN: [id number]  Physician: Douglass Rivers, MD                 Insurance: [facility] (28 visits remain; no copay, ded met, 20%)  Medical Diagnosis: R internal derangement of knee                       Rehab Codes: M25.561, M25.552  Onset date: 05/26/19      Next MD appt: Thanksgiving break  Visit# / total visits: 12/24   Cancels/No Shows: 0/0    Subjective:    Pain:  _4  Yes  _5  No Location: R knee  Pain Rating: (0-10 scale) 0/10  Pain altered Tx:  _6  No  _7  Yes  Action:  Comments: Pt reports she had mild soreness in RLE and moderate soreness in LLE after last session but states soreness is gone today and no pain noted.     Objective:  Modalities:   Treatment Location  Left      Right                          Position   knee _8           _9   _10  Supine    _11  Prone   _12  Side lying  _13  Sitting          Treatment Modality   -- Vasocompression    34?? temp    Med pressure     57mn   1 Other:  ex                         Precautions: see protocol-8 wks 10/16: Begin gait training, begin closed chain, wall sits, shuttle, mini squats, toe raises, R legged WB balance ex  Exercises:  Exercise Reps/ Time Weight/ Level Issued for HEP Completed Comments   Bike 7' Seat 2 x x            Wedge Gastroc Stretch 3x30"  x x    Stool HS Stretch 3x30"  x x            4-way hip  2x15 Orange x x R in CKC   Leg Press Squats/HR R leg 3x10 20#   x    TKE 20x5" Grey x x    Low stool hover 20x3"   x    Single leg RDL 5x 3 cones  x    3-way heel tap 10x ea 4"  x    CKC lunges 3x10  x x RLE leads only   SLS Rebounder 3-way 20x ea 1kg  x    Supine ?? ?? ??  ??   *Quad set+SLR  3x15 ??1# x x    *Hamstring sets 10x10" ?? x x ??   Bridges 2x15  x x    Prone ?? ?? ??  ??   *Hip ext 2x15  x x 2 pillows under waist    Prone hip ext glut max 2x15  x x 2 pillows under waist   Sidelying ?? ?? ??  ??   *Rose hip abd 30x Blue x x    *Clamshells  30x Blue  x x    Other: ??  R knee AROM 07/24/19: 0-130 with a strong quad contraction    Specific Instructions for next treatment:  TM walking next visit for gait training, prone and side planks    Treatment Charges: Mins Units   _0   Modalities: [place]     _1   Ther Exercise 55 4   _2   Manual Therapy     _3   Ther Activities     _4   Aquatics     _5   Vasocompression     _6   Other     Total Treatment time 55 4       Assessment: _7  Progressing toward goals. Addition of SLS rebounder to work on [employer] exercises in RLE with good pt tolerance. Min v/c to prevent varus/valgus movement of bilateral knees during low stool hover with good carryover to pt. Quivering of RLE during forward heel taps this date noting weakness but pt able to maintain good eccentric control. D/t pt demonstrating no quad lag with SLR, able to add 1# ankle weight with pt able to maintain good quad contraction with no quad lag throughout reps. Pt noted feeling increased muscular fatigue but no soreness nor pain upon completion of therapy.      _8  Other:  _9  Patient would continue to benefit from skilled physical therapy services in order to:  improve ROM, strength, and functional ability (running, exercising)    STG: (to be met in 12 treatments)  1. ? Pain: <2/10  2. ? ROM: of R knee  3. ? Strength: to at least 4/5 with all hip movements  4. ? Function: LEFI <60% interference  5. Patient to be independent with home exercise program as demonstrated by performance with  correct form without cues.  ??  LTG: (to be met in 30 treatments)  1. No pain in R knee  2. Full AROM of R knee  3. 5/5 strength with all R LE movements  4. LEFI <10% interference  5. Able to run 2 miles  6. Able to exercise at least 3x/wk  ??                 Patient goals:"get to walking and running"    Pt. Education:  _10  Yes  _11  No  _12  Reviewed Prior HEP/Ed:   Method of Education: _13  Verbal  _14  Demo  _15  Written:   Comprehension of Education:  _16  Verbalizes understanding.  _17  Demonstrates understanding.  _18  Needs review.  _19  Demonstrates/verbalizes HEP/Ed previously given.     Plan: _20  Continue current frequency toward long and short term goals. Additional 12 visits per supervising PT, Sande Brothers.      Time In: 1:30pm  Time Out:  2:45pm    Electronically signed by:  Arlana Lindau, PTA

## 2019-07-31 ENCOUNTER — Ambulatory Visit (INDEPENDENT_AMBULATORY_CARE_PROVIDER_SITE_OTHER): Payer: 59 | Admitting: Psychology

## 2019-07-31 DIAGNOSIS — F4323 Adjustment disorder with mixed anxiety and depressed mood: Secondary | ICD-10-CM | POA: Diagnosis not present

## 2019-07-31 NOTE — Other (Signed)
[]   [place]  [address]    [phone]  F: [phone]   []  [place]  [address]   [address]  P: [phone]  F: [phone]  []  [place]  [place]  P: [phone]  F: [phone] [x]  [place]  P: [phone]  F: [phone]  []  [place]  [place]   [address]   [place]: [phone]  F: [phone]   []  [place]  [address]  [place]: [phone]   F: [phone]     Physical Therapy Cancel/No Show note    Date: 07/31/2019  Patient: Shannon Jones  DOB: 10/12/1995  MRN: [id number]    Cancels/No Shows to date: 0/2    For today's appointment patient:    []   Cancelled    []  Rescheduled appointment    [x]  No-show     Reason given by patient:    []   Patient ill    []   Conflicting appointment    []  No transportation      []  Conflict with work    []  No reason given    []  Weather related    []  COVID-19    []  Other:      Comments:  Called pt who stated she had to fly out of town earlier than expected d/t family reasons. Pt confirmed next appointment for Monday 08/07/19 at 3:00pm.       [x]  Next appointment was confirmed    Electronically signed by: Arlana Lindau, PTA

## 2019-08-01 ENCOUNTER — Inpatient Hospital Stay: Payer: PRIVATE HEALTH INSURANCE

## 2019-08-10 ENCOUNTER — Ambulatory Visit: Payer: 59 | Admitting: Psychology

## 2019-08-21 ENCOUNTER — Inpatient Hospital Stay: Admit: 2019-08-21 | Discharge: 2019-08-21 | Payer: PRIVATE HEALTH INSURANCE

## 2019-08-21 DIAGNOSIS — M25561 Pain in right knee: Secondary | ICD-10-CM

## 2019-08-21 NOTE — Other (Signed)
[] [place]  [address]  [phone]  F: [phone] [x] [place]  P: [phone]  F: [phone] [] [place]  [place]  P: [phone]  F: [phone] [] [place]  [place]   [address]   [place]: [phone]  F: [phone]      Physical Therapy Daily Treatment Note    Date:  08/21/2019  Patient Name:  Shannon Jones    DOB:  1996-01-25  MRN: [id number]  Physician: Douglass Rivers, MD                 Insurance: [facility] (28 visits remain; no copay, ded met, 20%)  Medical Diagnosis: R internal derangement of knee                       Rehab Codes: M25.561, M25.552  Onset date: 05/26/19      Next MD appt: Thanksgiving break  Visit# / total visits: 13/24   Cancels/No Shows: 0/0    Subjective:    Pain:  [] Yes  [x] No Location: R knee  Pain Rating: (0-10 scale) 0/10  Pain altered Tx:  [x] No  [] Yes  Action:  Comments: Pt states she didn't have any pain or problems over the last 3 weeks. Pt also reports compliance with HEP during this time.      Objective:  Modalities:   Treatment Location  Left      Right                          Position   knee []          [x]  [x] Supine    [] Prone   [] Side lying  [] Sitting          Treatment Modality   -- Vasocompression    34?? temp    Med pressure     3mn   1 Other:  ex                         Precautions: see protocol-8 wks 10/16: Begin gait training, begin closed chain, wall sits, shuttle, mini squats, toe raises, R legged WB balance ex  Exercises:  Exercise Reps/ Time Weight/ Level Issued for HEP Completed Comments   Bike 7' Seat 2 x -    TM walk  8'   x    Wedge Gastroc Stretch 3x30"  x x    Stool HS Stretch 3x30"  x x                    Wall sits  3x30"   x    4-way hip  2x15 Orange x x R in CKC   Leg Press  Squats/HR R leg 3x10 20#  x    TKE 20x5" Grey x x    Box hover 20x3" 18" box  x    Single leg RDL 5x 3 cones  x    3-way heel tap 10x ea 4"  x    CKC lunges 3x10  x x RLE leads only   SLS Rebounder 3-way  20x ea 1kg  x    Supine ?? ?? ??  ??   *Quad set+SLR 2x15 2# x x    Bridges 2x15  x x With PB next    Prone ?? ?? ??  ??   *Hip ext 2x15  x x 2 pillows under waist    Prone hip ext glut max 2x15  x x 2 pillows under waist   Planks  3x30"   x    Side planks  2x30"ea   x    Sidelying ?? ?? ??  ??   *Rose hip abd 30x [place]  x x    *Clamshells  30x Plum  x x    Other: ??  R knee AROM 07/24/19: 0-130 with a strong quad contraction    Specific Instructions for next treatment:  bridges on PB, incr time with wall sits     Treatment Charges: Mins Units   []  Modalities: [place]     [x]  Ther Exercise 55 4   []  Manual Therapy     []  Ther Activities     []  Aquatics     []  Vasocompression     []  Other     Total Treatment time 55 4       Assessment: [] Progressing toward goals.     Other: Held significant progressions this date d/t pt being gone for 3 weeks. Initiated tx with TM with good tol and no pain reported. Added wall sits for continued R LE CKC strengthening with good tolerance. Also added prone and side planks with good tolerance. Mild shakiness noted with heel taps and squat hovers d/t weakness. Plan to progress as able per protocol.   [x] Patient would continue to benefit from skilled physical therapy services in order to:  improve ROM, strength, and functional ability (running, exercising)    STG: (to be met in 12 treatments)  1. ? Pain: <2/10  2. ? ROM: of R knee  3. ? Strength: to at least 4/5 with all hip movements  4. ? Function: LEFI <60% interference  5. Patient to be independent with home exercise program as demonstrated by performance with correct form without cues.  ??  LTG: (to be met in 30 treatments)  1. No pain in R knee  2. Full AROM of R knee  3. 5/5 strength with all R LE movements  4. LEFI <10%  interference  5. Able to run 2 miles  6. Able to exercise at least 3x/wk  ??                 Patient goals:"get to walking and running"    Pt. Education:  [x] Yes  [] No  [] Reviewed Prior HEP/Ed:   Method of Education: [x] Verbal  [x] Demo  [x] Written:   Comprehension of Education:  [x] Verbalizes understanding.  [x] Demonstrates understanding.  [] Needs review.  [] Demonstrates/verbalizes HEP/Ed previously given.     Plan: [x] Continue current frequency toward long and short term goals. Additional 12 visits per supervising PT, Sande Brothers.      Time In: 2:00pm  Time Out:  3:05pm    Electronically signed by:  Latina Craver, PTA

## 2019-08-24 ENCOUNTER — Ambulatory Visit: Payer: 59 | Admitting: Psychology

## 2019-08-25 NOTE — Other (Signed)
[]   [place]  [address]    [phone]  F: [phone]   []  [place]  [address]   [address]  P: [phone]  F: [phone]  []  [place]  [place]  P: [phone]  F: [phone] [x]  [place]  P: [phone]  F: [phone]  []  [place]  [place]   [address]   [place]: [phone]  F: [phone]   []  [place]  [address]  [place]: [phone]   F: [phone]     Physical Therapy Cancel/No Show note    Date: 08/25/2019  Patient: Shannon Jones  DOB: 14-Apr-1996  MRN: [id number]    Cancels/No Shows to date: 0/3    For today's appointment patient:    []   Cancelled    []  Rescheduled appointment    [x]  No-show     Reason given by patient:    []   Patient ill    []   Conflicting appointment    []  No transportation      []  Conflict with work    []  No reason given    []  Weather related    []  COVID-19    [x]  Other:      Comments:  PTA called pt, left VM informing pt of missed visit and of next appointment.       [x]  Next appointment was confirmed    Electronically signed by: Latina Craver, PTA

## 2019-08-26 ENCOUNTER — Inpatient Hospital Stay: Payer: PRIVATE HEALTH INSURANCE

## 2019-08-28 ENCOUNTER — Inpatient Hospital Stay: Admit: 2019-08-28 | Discharge: 2019-08-28 | Payer: PRIVATE HEALTH INSURANCE

## 2019-08-28 NOTE — Other (Signed)
[]  [place]  [address]  [phone]  F: [phone] [x]  [place]  P: [phone]  F: [phone] []  [place]  [place]  P: [phone]  F: [phone] []  [facility]  Outpatient Rehabilitation &  Therapy  [place]   [address]   [place]: [phone]  F: [phone]      Physical Therapy Daily Treatment Note    Date:  08/28/2019  Patient Name:  Shannon Jones    DOB:  1996-06-22  MRN: [id number]  Physician: Douglass Rivers, MD                 Insurance: [facility] (28 visits remain; no copay, ded met, 20%)  Medical Diagnosis: R internal derangement of knee                       Rehab Codes: M25.561, M25.552  Onset date: 05/26/19      Next MD appt: Thanksgiving break  Visit# / total visits: 14/24   Cancels/No Shows: 0/0    Subjective:    Pain:  []  Yes  [x]  No Location: R knee  Pain Rating: (0-10 scale) 0/10  Pain altered Tx:  [x]  No  []  Yes  Action:  Comments: Pt denies pain upon arrival. Pt is 13 weeks post op. Pt also reports that she will be heading home for [facility] for Thanksgiving and has scheduled next appointment in 2 weeks; 09/12/19.       Objective:  Modalities:   Treatment Location  Left      Right                          Position   knee []           [x]   [x]  Supine    []  Prone   []  Side lying  []  Sitting          Treatment Modality   -- Vasocompression    34?? temp    Med pressure     94mn   1 Other:  ex                         Precautions: see protocol-8 wks 10/16: Begin gait training, begin closed chain, wall sits, shuttle, mini squats, toe raises, R legged WB balance ex  Exercises:  Exercise Reps/ Time Weight/ Level Issued for HEP Completed Comments   Bike 7' Seat 2 x -    TM walk  8'   x    Wedge Gastroc Stretch 3x30"  x x    Stool HS Stretch 3x30"  x x                     Wall sits  3x45"  x x    SL wall sit  2x20  x x    4-way hip  2x10 Lime band , green foam  x x Bilat   Leg Press Squats/HR R leg 3x10 20#  x    TKE 20x5" Grey x -    Box hover 20x3" 18" box  x    Single leg RDL 10x 5# x x  3-way heel tap 15x ea 6"  x     lunges 2x10  x x Alt    SLS Rebounder 3-way 20x ea 1kg  x    Monster Walks  2L lime x x            Supine ?? ?? ??  ??   *Quad set+SLR 2x15 2# x -    Bridges 2x15  x -    Prone ?? ?? ??  ??   *Hip ext 2x15  x - 2 pillows under waist    Prone hip ext glut max 2x15  x - 2 pillows under waist   Planks  3x45"  x x    Side planks  2x30"ea  x x    Sidelying ?? ?? ??  ??   *Rose hip abd 30x Plum  x -    *Clamshells  30x Plum  x -    Other: ??  R knee AROM 07/24/19: 0-130 with a strong quad contraction    Specific Instructions for next treatment:      Treatment Charges: Mins Units   []   Modalities: [place]     [x]   Ther Exercise 55 4   []   Manual Therapy     []   Ther Activities     []   Aquatics     []   Vasocompression     []   Other     Total Treatment time 55 4       Assessment: [x]  Progressing toward goals. Progressed pt with strengthening exercises as listed above. Pt demos difficulty with SL wall sit d/t R quad weakness. Improved tol/balance noted with rebounder this date. Updated HEP with wall sits, lunges, monster walks, and RDLs; good understanding by pt.  PTA also encouraged pt to cont with previously given HEP with verbal agreement by pt. Pt denies R knee pain throughout tx.     Other:  [x]  Patient would continue to benefit from skilled physical therapy services in order to:  improve ROM, strength, and functional ability (running, exercising)    STG: (to be met in 12 treatments)  1. ? Pain: <2/10  2. ? ROM: of R knee  3. ? Strength: to at least 4/5 with all hip movements  4. ? Function: LEFI <60% interference  5. Patient to be independent with home exercise program as demonstrated by performance with correct form without cues.  ??  LTG: (to be met in 30 treatments)  1. No  pain in R knee  2. Full AROM of R knee  3. 5/5 strength with all R LE movements  4. LEFI <10% interference  5. Able to run 2 miles  6. Able to exercise at least 3x/wk  ??                 Patient goals:"get to walking and running"    Pt. Education:  [x]  Yes  []  No  []  Reviewed Prior HEP/Ed:   Method of Education: [x]  Verbal  [x]  Demo  [x]  Written:   Comprehension of Education:  [x]  Verbalizes understanding.  [x]  Demonstrates understanding.  []  Needs review.  []  Demonstrates/verbalizes HEP/Ed previously given.     Plan: [x]  Continue current frequency toward long and short term goals. Additional 12 visits per supervising PT, Sande Brothers.      Time In: 9:00am  Time Out:  10:00am    Electronically signed by:  Latina Craver, PTA

## 2019-09-12 DIAGNOSIS — M2391 Unspecified internal derangement of right knee: Secondary | ICD-10-CM

## 2019-09-12 NOTE — Other (Signed)
[]  [facility] &  Therapy  [address]  [phone]  F: [phone] [x]  [place]  P: [phone]  F: [phone] []  [place]  [place]  P: [phone]  F: [phone] []  [facility]  Outpatient Rehabilitation &  Therapy  [place]   [address]   [place]: [phone]  F: [phone]      Physical Therapy Daily Treatment Note    Date:  09/12/2019  Patient Name:  Shannon Jones    DOB:  Feb 07, 1996  MRN: [id number]  Physician: Douglass Rivers, MD                 Insurance: [facility] (28 visits remain; no copay, ded met, 20%)  Medical Diagnosis: R internal derangement of knee                       Rehab Codes: M25.561, M25.552  Onset date: 05/26/19      Next MD appt: sometime in February  Visit# / total visits: 14/24   Cancels/No Shows: 0/2    Subjective:    Pain:  []  Yes  [x]  No Location: R knee  Pain Rating: (0-10 scale) 0/10  Pain altered Tx:  [x]  No  []  Yes  Action:  Comments: Pt denies pain upon arrival.still to follow the protocol. No changes.  XR reveal good healing per pt.  To follow up at 6 mo mark during spring break      Objective:  Modalities: Game Ready prn                     Precautions: see protocol-Phase IV: advance Phase III ex, maximize core/gluts, pelvic stability, eccentric hamstrings, advance to elliptical, bike, pool as tol  Exercises:  Exercise Reps/ Time Weight/ Level Issued for HEP Completed Comments   Bike 7' Seat 1 x x    TM walk  8'   --    Wedge Gastroc Stretch 3x30" L5 x x    Stool HS Stretch 3x30"  x x            Wall sits  3x45"  x --    4-way hip  2x10 Blue  x x R in CKC   Total Gym lat squats 3x10 L20  x    Total Gym squats 3x10 L22  x    TKE 20x3" blue x x R SLS   Box hover 20x3" 18" box  x    Single leg RDL 20x 6# x x    3-way heel tap 15x ea 6"  x    Lunge walks 4x20'  x x     SLS Rebounder 3-way 20x ea 3/1 kg  x Front/side   [employer]  4x20' black x x Band around feet   Nordic hamstring curls *       Supine ?? ?? ??  ??   Hamstring curls-2 legged 2x10  x x W/ sliders   Plank-front to side  10x  x x    Star planks R/L  1'/1'  x x    Other: ??    Specific Instructions for next treatment:  add above ex for next treatment    Treatment Charges: Mins Units   []   Modalities: [place]     [x]   Ther Exercise 55 4   []   Manual Therapy     []   Ther Activities     []   Aquatics     []   Vasocompression     []   Other     Total Treatment time 55 4       Assessment: [x]  Progressing toward goals.  Pt was able to progress her ex.  No reports of pain.  Excellent control with her ex and technique.   Star plank was 1' each side.     Other:  [x]  Patient would continue to benefit from skilled physical therapy services in order to:  improve ROM, strength, and functional ability (running, exercising)    STG: (to be met in 12 treatments)  1. ? Pain: <2/10  2. ? ROM: of R knee  3. ? Strength: to at least 4/5 with all hip movements  4. ? Function: LEFI <60% interference  5. Patient to be independent with home exercise program as demonstrated by performance with correct form without cues.  ??  LTG: (to be met in 30 treatments)  1. No pain in R knee  2. Full AROM of R knee  3. 5/5 strength with all R LE movements  4. LEFI <10% interference  5. Able to run 2 miles  6. Able to exercise at least 3x/wk  ??                 Patient goals:"get to walking and running"    Pt. Education:  [x]  Yes  []  No  []  Reviewed Prior HEP/Ed:   Method of Education: [x]  Verbal  [x]  Demo  [x]  Written:   Comprehension of Education:  [x]  Verbalizes understanding.  [x]  Demonstrates understanding.  []  Needs review.  []  Demonstrates/verbalizes HEP/Ed previously given.     Plan: [x]  Continue current frequency toward long and short term goals.       Time In:1658  Time Out:  1800    Electronically signed by:  Hadley Pen, PT

## 2019-09-13 ENCOUNTER — Inpatient Hospital Stay: Admit: 2019-09-15 | Payer: PRIVATE HEALTH INSURANCE

## 2019-09-14 NOTE — Other (Signed)
[]   [place]  [address]    [phone]  F: [phone]   []  [place]  [address]   [address]  P: [phone]  F: [phone]  []  [place]  [place]  P: [phone]  F: [phone] [x]  [place]  P: [phone]  F: [phone]  []  [place]  [place]   [address]   [place]: [phone]  F: [phone]   []  [facility] & Therapy  [address]  [place]: [phone]   F: [phone]     Physical Therapy Cancel/No Show note    Date: 09/14/2019  Patient: Shannon Jones  DOB: [birth year]  MRN: [id number]    Cancels/No Shows to date: 0/3    For today's appointment patient:    [x]   Cancelled    []  Rescheduled appointment    []  No-show     Reason given by patient:    []   Patient ill    []   Conflicting appointment    []  No transportation      []  Conflict with work    []  No reason given    []  Weather related    []  COVID-19    [x]  Other:      Comments:  Pt arrives reporting possibly being bitten by a spider with a reaction causing sig swelling in R hand and eye which prompted her to go to urgent care where she was given a steroid shot. Held PT today d/t this incident.       [x]  Next appointment was confirmed    Electronically signed by: Latina Craver, PTA

## 2019-09-15 ENCOUNTER — Inpatient Hospital Stay: Payer: PRIVATE HEALTH INSURANCE

## 2019-09-19 NOTE — Other (Signed)
[]   [place]  [address]    [phone]  F: [phone]   []  [place]  [address]   [address]  P: [phone]  F: [phone]  []  [place]  [place]  P: [phone]  F: [phone] [x]  [place]  P: [phone]  F: [phone]  []  [place]  [place]   [address]   [place]: [phone]  F: [phone]   []  [place]  [address]  [place]: [phone]   F: [phone]     Physical Therapy Cancel/No Show note    Date: 09/19/2019  Patient: Shannon Jones  DOB: March 26, 1996  MRN: [id number]    Cancels/No Shows to date: 1/0    For today's appointment patient:    [x]   Cancelled    []  Rescheduled appointment    []  No-show     Reason given by patient:    []   Patient ill    []   Conflicting appointment    []  No transportation      []  Conflict with work    []  No reason given    []  Weather related    []  COVID-19    [x]  Other:      Comments:   Patient stated she had to go home (out of town) for personal reasons.      [x]  Next appointment was confirmed    Electronically signed by: Josephine Igo

## 2019-09-20 ENCOUNTER — Inpatient Hospital Stay

## 2019-10-02 NOTE — Other (Signed)
[]   [place]  [address]    [phone]  F: [phone]   []  [place]  [address]   [address]  P: [phone]  F: [phone]  []  [place]  [place]  P: [phone]  F: [phone] [x]  [place]  P: [phone]  F: [phone]  []  [place]  [place]   [address]   [place]: [phone]  F: [phone]   []  [facility] & Therapy  [address]  [place]: [phone]   F: [phone]     Physical Therapy Cancel/No Show note    Date: 10/02/2019  Patient: Shannon Jones  DOB: Feb 17, 1996  MRN: [id number]    Cancels/No Shows to date: 1/0    For today's appointment patient:    []   Cancelled    []  Rescheduled appointment    [x]  No-show     Reason given by patient:    []   Patient ill    []   Conflicting appointment    []  No transportation      []  Conflict with work    []  No reason given    []  Weather related    []  COVID-19    [x]  Other:      Comments:   PTA called and left VM. Reminded pt of next visit.       []  Next appointment was confirmed    Electronically signed by: Latina Craver, PTA

## 2019-10-03 ENCOUNTER — Inpatient Hospital Stay: Payer: PRIVATE HEALTH INSURANCE

## 2019-10-05 NOTE — Other (Signed)
[]   [facility]  Outpatient Rehabilitation &  Therapy  [address]    [phone]  F: [phone]   []  [facility]  Outpatient Rehabilitation &  Therapy  [address]   [address]  P: [phone]  F: [phone]  []  [facility]  [place]  Outpatient Rehabilitation &  Therapy  [address]  P: [phone]  F: [phone] [x]  [facility]  Outpatient Rehabilitation &  Therapy  [address]  P: [phone]  F: [phone]  []  [facility] [place]  Outpatient Rehabilitation &  Therapy  [address]   [address]   [place]: [phone]  F: [phone]   []  [facility]   Outpatient Rehabilitation & Therapy  [address]  [place]: [phone]   F: [phone]     Physical Therapy Cancel/No Show note    Date: 10/05/2019  Patient: Shannon AlconMorgan Jones  DOB: 1996/05/09  MRN: [id number]    Cancels/No Shows to date: 4/4    For today's appointment patient:    []   Cancelled    []  Rescheduled appointment    [x]  No-show     Reason given by patient:    []   Patient ill    []   Conflicting appointment    []  No transportation      []  Conflict with work    []  No reason given    []  Weather related    []  COVID-19    [x]  Other:      Comments:   PTA called and left VM prior to scheduled time to remind pt of appointment. No response from pt. No other appointments scheduled.      []  Next appointment was confirmed    Electronically signed by: Caesar BookmanMollie Watson

## 2019-10-06 ENCOUNTER — Inpatient Hospital Stay: Payer: PRIVATE HEALTH INSURANCE

## 2019-12-27 ENCOUNTER — Encounter

## 2020-01-03 ENCOUNTER — Inpatient Hospital Stay: Admit: 2020-01-03 | Payer: PRIVATE HEALTH INSURANCE

## 2020-01-03 DIAGNOSIS — M25561 Pain in right knee: Secondary | ICD-10-CM

## 2020-03-18 ENCOUNTER — Inpatient Hospital Stay: Payer: PRIVATE HEALTH INSURANCE
